# Patient Record
Sex: Male | Born: 1970
Health system: Southern US, Community
[De-identification: ages and names within clinical notes are randomized; demographics above are authoritative.]

## PROBLEM LIST (undated history)

## (undated) DIAGNOSIS — I10 Essential (primary) hypertension: Secondary | ICD-10-CM

## (undated) DIAGNOSIS — M1990 Unspecified osteoarthritis, unspecified site: Secondary | ICD-10-CM

## (undated) DIAGNOSIS — Z9289 Personal history of other medical treatment: Secondary | ICD-10-CM

## (undated) DIAGNOSIS — J45909 Unspecified asthma, uncomplicated: Secondary | ICD-10-CM

## (undated) DIAGNOSIS — K219 Gastro-esophageal reflux disease without esophagitis: Secondary | ICD-10-CM

## (undated) DIAGNOSIS — R011 Cardiac murmur, unspecified: Secondary | ICD-10-CM

## (undated) DIAGNOSIS — E785 Hyperlipidemia, unspecified: Secondary | ICD-10-CM

## (undated) HISTORY — DX: Unspecified asthma, uncomplicated: J45.909

## (undated) HISTORY — DX: Personal history of other medical treatment: Z92.89

## (undated) HISTORY — DX: Hyperlipidemia, unspecified: E78.5

## (undated) HISTORY — DX: Essential (primary) hypertension: I10

## (undated) HISTORY — DX: Cardiac murmur, unspecified: R01.1

## (undated) HISTORY — DX: Unspecified osteoarthritis, unspecified site: M19.90

## (undated) HISTORY — DX: Gastro-esophageal reflux disease without esophagitis: K21.9

---
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
## 1998-12-29 ENCOUNTER — Encounter: Payer: Self-pay | Admitting: *Deleted

## 1998-12-29 ENCOUNTER — Emergency Department (HOSPITAL_COMMUNITY): Admission: EM | Admit: 1998-12-29 | Discharge: 1998-12-29 | Payer: Self-pay | Admitting: Emergency Medicine

## 2000-12-14 ENCOUNTER — Emergency Department (HOSPITAL_COMMUNITY): Admission: EM | Admit: 2000-12-14 | Discharge: 2000-12-15 | Payer: Self-pay | Admitting: Emergency Medicine

## 2006-05-20 HISTORY — PX: FOOT FRACTURE SURGERY: SHX645

## 2008-05-23 ENCOUNTER — Emergency Department (HOSPITAL_COMMUNITY): Admission: EM | Admit: 2008-05-23 | Discharge: 2008-05-23 | Payer: Self-pay | Admitting: Emergency Medicine

## 2008-05-30 ENCOUNTER — Emergency Department (HOSPITAL_COMMUNITY): Admission: EM | Admit: 2008-05-30 | Discharge: 2008-05-30 | Payer: Self-pay | Admitting: Family Medicine

## 2010-08-31 ENCOUNTER — Ambulatory Visit (INDEPENDENT_AMBULATORY_CARE_PROVIDER_SITE_OTHER): Payer: Commercial Managed Care - PPO | Admitting: Cardiovascular Disease

## 2010-08-31 ENCOUNTER — Encounter: Payer: Self-pay | Admitting: Cardiovascular Disease

## 2010-08-31 VITALS — BP 140/94 | HR 71 | Ht 67.0 in | Wt 213.8 lb

## 2010-08-31 DIAGNOSIS — I10 Essential (primary) hypertension: Secondary | ICD-10-CM

## 2010-08-31 LAB — BASIC METABOLIC PANEL
BUN: 13 mg/dL (ref 6–23)
CO2: 29 mEq/L (ref 19–32)
Calcium: 9.4 mg/dL (ref 8.4–10.5)
Chloride: 101 mEq/L (ref 96–112)
Creatinine, Ser: 1 mg/dL (ref 0.4–1.5)
GFR: 90.03 mL/min (ref >60.00)
Glucose, Bld: 82 mg/dL (ref 70–99)
Potassium: 4.8 mEq/L (ref 3.5–5.1)
Sodium: 138 mEq/L (ref 135–145)

## 2010-08-31 MED ORDER — HYDROCHLOROTHIAZIDE 25 MG PO TABS: 25.0000 mg | ORAL_TABLET | Freq: Every day | ORAL | Status: DC

## 2010-08-31 MED ORDER — POTASSIUM CHLORIDE ER 10 MEQ PO TBCR: 10.0000 meq | EXTENDED_RELEASE_TABLET | Freq: Every day | ORAL | Status: DC

## 2010-08-31 NOTE — Progress Notes (Signed)
Jeffrey Sutton Date of Birth  1970-05-27 [facility] [address]     [address] [place], [place]  [address] [phone]  Fax  [phone]  History of Present Illness:  Jeffrey Sutton Is a young gentleman with a history of hypertension. He presents  today for followup visit. He has not had any episodes of chest pain or shortness of breath.  He lost his job over the past year. He has done Holiday representative and other eyedrops. He keeps very busy.   Current Outpatient Prescriptions  Medication Sig Dispense Refill  . DISCONTD: hydrochlorothiazide 25 MG tablet Take 25 mg by mouth daily.        Marland Kitchen DISCONTD: potassium chloride (K-DUR,KLOR-CON) 10 MEQ tablet Take 10 mEq by mouth daily.        Marland Kitchen aspirin 81 MG tablet Take 81 mg by mouth daily.        . hydrochlorothiazide 25 MG tablet Take 1 tablet (25 mg total) by mouth daily.  90 tablet  3  . potassium chloride (K-DUR) 10 MEQ tablet Take 1 tablet (10 mEq total) by mouth daily.  90 tablet  3     Allergies  Allergen Reactions  . Benadryl (Altaryl)   . Novocain     Past Medical History  Diagnosis Date  . Hypertension   . Chest pain     No past surgical history on file.  History  Smoking status  . Current Everyday Smoker -- 2.0 packs/day  . Types: Cigarettes  Smokeless tobacco  . Not on file  I recommended that he stop smoking.  History  Alcohol Use No    Family History  Problem Relation Age of Onset  . Cancer Mother     Reviw of Systems:  Reviewed in the HPI.  All other systems are negative.  Physical Exam: BP 140/94  Pulse 71  Ht 5\' 7"  (1.702 m)  Wt 213 lb 12.8 oz (96.979 kg)  BMI 33.49 kg/m2 The patient is alert and oriented x 3.  The mood and affect are normal.  The skin is warm and dry.  Color is normal.  The HEENT exam reveals that the sclera are nonicteric.  The mucous membranes are moist.  The carotids are 2+ without bruits.  There is no thyromegaly.  There is no JVD.  The  lungs are clear.  The chest wall is non tender.  The heart exam reveals a regular rate with a normal S1 and S2.  There are no murmurs, gallops, or rubs.  The PMI is not displaced.   Abdominal exam reveals good bowel sounds.  There is no guarding or rebound.  There is no hepatosplenomegaly or tenderness.  There are no masses.  Exam of the legs reveal no clubbing, cyanosis, or edema.  The legs are without rashes.  The distal pulses are intact.  Cranial nerves II - XII are intact.  Motor and sensory functions are intact.  The gait is normal.  ECG: Normal sinus rhythm. Normal EKG. Assessment / Plan:

## 2010-08-31 NOTE — Assessment & Plan Note (Signed)
His blood pressure is mildly elevated today. I've asked him to try to cut back and eventually stop smoking. This certainly will help with his hypertension. We will continue with his same medications. I'll see him again in one year.

## 2010-09-03 ENCOUNTER — Telehealth: Payer: Self-pay | Admitting: *Deleted

## 2010-09-03 NOTE — Telephone Encounter (Signed)
Patient called with lab results. msg left normal lab results, Alfonso Ramus RN

## 2010-09-03 NOTE — Telephone Encounter (Signed)
Message copied by Mahalia Longest on Mon Sep 03, 2010 12:27 PM ------      Message from: [place], [place]      Created: Fri Aug 31, 2010  4:54 PM       Randie Heinz

## 2010-09-06 ENCOUNTER — Encounter: Payer: Self-pay | Admitting: Cardiovascular Disease

## 2011-09-03 ENCOUNTER — Telehealth: Payer: Self-pay | Admitting: *Deleted

## 2011-09-03 DIAGNOSIS — I10 Essential (primary) hypertension: Secondary | ICD-10-CM

## 2011-09-03 MED ORDER — POTASSIUM CHLORIDE CRYS ER 10 MEQ PO TBCR: 10.0000 meq | EXTENDED_RELEASE_TABLET | Freq: Two times a day (BID) | ORAL | Status: DC

## 2011-09-03 MED ORDER — HYDROCHLOROTHIAZIDE 25 MG PO TABS: 25.0000 mg | ORAL_TABLET | Freq: Every day | ORAL | Status: DC

## 2011-09-03 NOTE — Telephone Encounter (Signed)
Pt request refills

## 2011-10-18 ENCOUNTER — Ambulatory Visit (INDEPENDENT_AMBULATORY_CARE_PROVIDER_SITE_OTHER): Payer: 59 | Admitting: Cardiovascular Disease

## 2011-10-18 ENCOUNTER — Encounter: Payer: Self-pay | Admitting: Cardiovascular Disease

## 2011-10-18 VITALS — BP 110/88 | HR 80 | Ht 67.0 in | Wt 205.1 lb

## 2011-10-18 DIAGNOSIS — I10 Essential (primary) hypertension: Secondary | ICD-10-CM

## 2011-10-18 LAB — BASIC METABOLIC PANEL
BUN: 13 mg/dL (ref 6–23)
CO2: 30 mEq/L (ref 19–32)
Calcium: 9.3 mg/dL (ref 8.4–10.5)
Creatinine, Ser: 1.2 mg/dL (ref 0.4–1.5)
GFR: 73.69 mL/min (ref >60.00)

## 2011-10-18 MED ORDER — HYDROCHLOROTHIAZIDE 25 MG PO TABS: 25.0000 mg | ORAL_TABLET | Freq: Every day | ORAL | Status: DC

## 2011-10-18 MED ORDER — POTASSIUM CHLORIDE CRYS ER 10 MEQ PO TBCR: 10.0000 meq | EXTENDED_RELEASE_TABLET | Freq: Two times a day (BID) | ORAL | Status: DC

## 2011-10-18 NOTE — Progress Notes (Signed)
    Jeffrey Sutton Date of Birth  1970-11-18       [facility]    [place] [address]  [address], [address] [place], [place]  [address]   [place], [place]  [address] [phone]     [phone]   Fax  [phone]    Fax [phone]  Problem List: 1. Hypertension  History of Present Illness:  Jeffrey Sutton is a 41 yo with hx of HTN.    Current Outpatient Prescriptions on File Prior to Visit  Medication Sig Dispense Refill  . hydrochlorothiazide (HYDRODIURIL) 25 MG tablet Take 1 tablet (25 mg total) by mouth daily.  90 tablet  0  . DISCONTD: potassium chloride (K-DUR,KLOR-CON) 10 MEQ tablet Take 1 tablet (10 mEq total) by mouth 2 (two) times daily.  90 tablet  0  . potassium chloride (K-DUR) 10 MEQ tablet Take 1 tablet (10 mEq total) by mouth daily.  90 tablet  3    Allergies  Allergen Reactions  . Benadryl (Diphenhydramine Hcl)   . Procaine Hcl     Past Medical History  Diagnosis Date  . Hypertension   . Chest pain     No past surgical history on file.  History  Smoking status  . Current Everyday Smoker -- 2.0 packs/day  . Types: Cigarettes  Smokeless tobacco  . Not on file    History  Alcohol Use No    Family History  Problem Relation Age of Onset  . Cancer Mother     Reviw of Systems:  Reviewed in the HPI.  All other systems are negative.  Physical Exam: Blood pressure 110/88, pulse 80, height 5\' 7"  (1.702 m), weight 205 lb 1.9 oz (93.042 kg). General: Well developed, well nourished, in no acute distress.  Head: Normocephalic, atraumatic, sclera non-icteric, mucus membranes are moist,   Neck: Supple. Carotids are 2 + without bruits. No JVD  Lungs: Clear bilaterally to auscultation.  Heart: regular rate.  normal  S1 S2. No murmurs, gallops or rubs.  Abdomen: Soft, non-tender, non-distended with normal bowel sounds. No hepatomegaly. No rebound/guarding. No masses.  Msk:  Strength and tone are  normal  Extremities: No clubbing or cyanosis. No edema.  Distal pedal pulses are 2+ and equal bilaterally.  Neuro: Alert and oriented X 3. Moves all extremities spontaneously.  Psych:  Responds to questions appropriately with a normal affect.  ECG: 10/18/2011-normal sinus rhythm at 80 beats a minute. Normal EKG  Assessment / Plan:

## 2011-10-18 NOTE — Assessment & Plan Note (Signed)
Jeffrey Sutton seems to be doing very well. His blood pressure is fairly well controlled.  We'll give him some information on the-diet. We'll continue with her same medications. We'll check a basic metabolic profile today.  I'll see her again in one year for an EKG and basic metabolic profile.

## 2011-10-18 NOTE — Patient Instructions (Signed)
Your physician wants you to follow-up in: 1 YEAR You will receive a reminder letter in the mail two months in advance. If you don't receive a letter, please call our office to schedule the follow-up appointment.  Your physician recommends that you return for lab work in: TODAY BMET  REDUCE HIGH SODIUM FOODS LIKE CANNED SOUP, GRAVY, SAUCES, READY PREPARED FOODS LIKE FROZEN FOODS; LEAN CUISINE, LASAGNA. BACON, SAUSAGE, LUNCH MEAT, FAST FOODS.Marland Kitchen   DASH Diet   The DASH diet stands for "Dietary Approaches to Stop Hypertension." It is a healthy eating plan that has been shown to reduce high blood pressure (hypertension) in as little as 14 days, while also possibly providing other significant health benefits. These other health benefits include reducing the risk of breast cancer after menopause and reducing the risk of type 2 diabetes, heart disease, colon cancer, and stroke. Health benefits also include weight loss and slowing kidney failure in patients with chronic kidney disease.  DIET GUIDELINES  Limit salt (sodium). Your diet should contain less than 1500 mg of sodium daily.   Limit refined or processed carbohydrates. Your diet should include mostly whole grains. Desserts and added sugars should be used sparingly.   Include small amounts of heart-healthy fats. These types of fats include nuts, oils, and tub margarine. Limit saturated and trans fats. These fats have been shown to be harmful in the body.  CHOOSING FOODS  The following food groups are based on a 2000 calorie diet. See your Registered Dietitian for individual calorie needs. Grains and Grain Products (6 to 8 servings daily)  Eat More Often: Whole-wheat bread, brown rice, whole-grain or wheat pasta, quinoa, popcorn without added fat or salt (air popped).   Eat Less Often: White bread, white pasta, white rice, cornbread.  Vegetables (4 to 5 servings daily)  Eat More Often: Fresh, frozen, and canned vegetables. Vegetables may be raw,  steamed, roasted, or grilled with a minimal amount of fat.   Eat Less Often/Avoid: Creamed or fried vegetables. Vegetables in a cheese sauce.  Fruit (4 to 5 servings daily)  Eat More Often: All fresh, canned (in natural juice), or frozen fruits. Dried fruits without added sugar. One hundred percent fruit juice ( cup [237 mL] daily).   Eat Less Often: Dried fruits with added sugar. Canned fruit in light or heavy syrup.  [employer], Fish, and Poultry (2 servings or less daily. One serving is 3 to 4 oz [85-114 g]).  Eat More Often: Ninety percent or leaner ground beef, tenderloin, sirloin. Round cuts of beef, chicken breast, [place] breast. All fish. Grill, bake, or broil your meat. Nothing should be fried.   Eat Less Often/Avoid: Fatty cuts of meat, [place], or chicken leg, thigh, or wing. Fried cuts of meat or fish.  Dairy (2 to 3 servings)  Eat More Often: Low-fat or fat-free milk, low-fat plain or light yogurt, reduced-fat or part-skim cheese.   Eat Less Often/Avoid: Milk (whole, 2%, skim, or chocolate).Whole milk yogurt. Full-fat cheeses.  Nuts, Seeds, and Legumes (4 to 5 servings per week)  Eat More Often: All without added salt.   Eat Less Often/Avoid: Salted nuts and seeds, canned beans with added salt.  Fats and Sweets (limited)  Eat More Often: Vegetable oils, tub margarines without trans fats, sugar-free gelatin. Mayonnaise and salad dressings.   Eat Less Often/Avoid: Coconut oils, palm oils, butter, stick margarine, cream, half and half, cookies, candy, pie.  FOR MORE INFORMATION The Dash Diet Eating Plan: www.dashdiet.org Document Released: 04/25/2011 Document  Reviewed: 04/15/2011 [facility] Patient Information 2012 [place], [place].

## 2011-10-28 ENCOUNTER — Telehealth: Payer: Self-pay | Admitting: Cardiovascular Disease

## 2011-10-28 NOTE — Telephone Encounter (Signed)
Lab results were given to Jeffrey Sutton.  (see result note on labs)

## 2011-10-28 NOTE — Telephone Encounter (Signed)
New Prbolem:    Patient called in returning Jodette's call about his labwork results.  Please call back.

## 2012-05-26 ENCOUNTER — Other Ambulatory Visit: Payer: Self-pay | Admitting: *Deleted

## 2012-05-26 DIAGNOSIS — I10 Essential (primary) hypertension: Secondary | ICD-10-CM

## 2012-05-26 MED ORDER — POTASSIUM CHLORIDE CRYS ER 10 MEQ PO TBCR: 10.0000 meq | EXTENDED_RELEASE_TABLET | Freq: Every day | ORAL | Status: DC

## 2012-05-26 NOTE — Telephone Encounter (Signed)
NEED APPOINTMENT Fax Received. Refill Completed. Jeffrey Sutton (R.M.A)   

## 2012-05-27 ENCOUNTER — Other Ambulatory Visit: Payer: Self-pay | Admitting: *Deleted

## 2012-05-29 ENCOUNTER — Other Ambulatory Visit: Payer: Self-pay | Admitting: Cardiovascular Disease

## 2012-05-29 DIAGNOSIS — I10 Essential (primary) hypertension: Secondary | ICD-10-CM

## 2012-05-29 MED ORDER — POTASSIUM CHLORIDE CRYS ER 10 MEQ PO TBCR: 10.0000 meq | EXTENDED_RELEASE_TABLET | Freq: Every day | ORAL | Status: DC

## 2012-05-29 NOTE — Progress Notes (Signed)
Refilled Kdur at Pt's wife request.  Will need BMP on return

## 2012-06-17 ENCOUNTER — Ambulatory Visit: Payer: Self-pay | Admitting: Family Medicine

## 2012-06-17 VITALS — BP 129/93 | HR 101 | Temp 98.1°F | Resp 18 | Ht 69.0 in | Wt 213.0 lb

## 2012-06-17 DIAGNOSIS — Z0289 Encounter for other administrative examinations: Secondary | ICD-10-CM

## 2012-06-17 DIAGNOSIS — Z024 Encounter for examination for driving license: Secondary | ICD-10-CM

## 2012-06-17 LAB — POCT URINALYSIS DIPSTICK
Bilirubin, UA: NEGATIVE
Ketones, UA: NEGATIVE
Leukocytes, UA: NEGATIVE
Spec Grav, UA: >=1.03

## 2012-06-17 NOTE — Patient Instructions (Addendum)
Your UA looks fine today. Take care

## 2012-06-17 NOTE — Progress Notes (Signed)
[facility] [address], [place] [address] [phone]  Date:  06/17/2012   Name:  Jeffrey Sutton   DOB:  04/30/1971   MRN:  [id number]  PCP:  No primary provider on file.    Chief Complaint: Other   History of Present Illness:  Jeffrey Sutton is a 42 y.o. 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  He had his PE performed at an other office in [place] yesterday- however they were not able to perform a UA.  He actually does not need a drug screen or any other test.  He otherwise qualifies for a 2 year card.  He brought his DOT forms with him from yesterday and the only problem is the lack of UA result  Patient Active Problem List  Diagnosis  . Hypertension  . Chest pain    Past Medical History  Diagnosis Date  . Hypertension   . Chest pain     No past surgical history on file.  History  Substance Use Topics  . Smoking status: Current Every Day Smoker -- 2.0 packs/day    Types: Cigarettes  . Smokeless tobacco: Not on file  . Alcohol Use: No    Family History  Problem Relation Age of Onset  . Cancer Mother     Allergies  Allergen Reactions  . Benadryl (Diphenhydramine Hcl)   . Procaine Hcl     Medication list has been reviewed and updated.  Current Outpatient Prescriptions on File Prior to Visit  Medication Sig Dispense Refill  . hydrochlorothiazide (HYDRODIURIL) 25 MG tablet Take 1 tablet (25 mg total) by mouth daily.  90 tablet  3  . potassium chloride (K-DUR,KLOR-CON) 10 MEQ tablet Take 1 tablet (10 mEq total) by mouth daily.  90 tablet  1  . potassium chloride (K-DUR) 10 MEQ tablet Take 1 tablet (10 mEq total) by mouth daily.  90 tablet  3    Review of Systems:  As per HPI- otherwise negative.   Physical Examination: Filed Vitals:   06/17/12 0857  BP: 129/93  Pulse: 101  Temp: 98.1 F (36.7 C)  Resp: 18   Filed Vitals:   06/17/12 0857  Height: 5\' 9"   (1.753 m)  Weight: 213 lb (96.616 kg)   Body mass index is 31.45 kg/(m^2). Ideal Body Weight: Weight in (lb) to have BMI = 25: 168.9    GEN: WDWN, NAD, Non-toxic, Alert & Oriented x 3 HEENT: Atraumatic, Normocephalic.  Ears and Nose: No external deformity. EXTR: No clubbing/cyanosis/edema NEURO: Normal gait.  PSYCH: Normally interactive. Conversant. Not depressed or anxious appearing.  Calm demeanor.   Results for orders placed in visit on 06/17/12  POCT URINALYSIS DIPSTICK      Component Value Range   Color, UA yellow     Clarity, UA clear     Glucose, UA neg     Bilirubin, UA neg     Ketones, UA neg     Spec Grav, UA >=1.030     Blood, UA neg     pH, UA 6.0     Protein, UA neg     Urobilinogen, UA 0.2     Nitrite, UA neg     Leukocytes, UA Negative      Assessment and Plan: 1. Encounter for commercial driving license (CDL) exam  POCT urinalysis dipstick   Here for UA today only.  Looks fine, wrote  an addendum on his form  Vlada Uriostegui, MD

## 2012-09-18 ENCOUNTER — Other Ambulatory Visit: Payer: Self-pay | Admitting: *Deleted

## 2012-09-18 DIAGNOSIS — I10 Essential (primary) hypertension: Secondary | ICD-10-CM

## 2012-09-18 MED ORDER — POTASSIUM CHLORIDE CRYS ER 10 MEQ PO TBCR: 20.0000 meq | EXTENDED_RELEASE_TABLET | Freq: Every day | ORAL | Status: DC

## 2012-09-18 NOTE — Telephone Encounter (Signed)
Pt walked in needing k+ filled and that it was filled last for 10 meq once daily/ last script per last lab should be 20 meq daily. Script changed and sent/ pt to have app made.

## 2012-11-18 ENCOUNTER — Ambulatory Visit (INDEPENDENT_AMBULATORY_CARE_PROVIDER_SITE_OTHER): Payer: 59 | Admitting: Cardiovascular Disease

## 2012-11-18 ENCOUNTER — Encounter: Payer: Self-pay | Admitting: Cardiovascular Disease

## 2012-11-18 VITALS — BP 134/90 | HR 87 | Ht 69.0 in | Wt 211.0 lb

## 2012-11-18 DIAGNOSIS — I10 Essential (primary) hypertension: Secondary | ICD-10-CM

## 2012-11-18 DIAGNOSIS — R079 Chest pain, unspecified: Secondary | ICD-10-CM

## 2012-11-18 LAB — BASIC METABOLIC PANEL
CO2: 24 mEq/L (ref 19–32)
Chloride: 101 mEq/L (ref 96–112)
Sodium: 138 mEq/L (ref 135–145)

## 2012-11-18 NOTE — Patient Instructions (Signed)
Your physician wants you to follow-up in: 1 year You will receive a reminder letter in the mail two months in advance. If you don't receive a letter, please call our office to schedule the follow-up appointment.  Your physician recommends that you return for lab work in: today/ bmet

## 2012-11-18 NOTE — Assessment & Plan Note (Signed)
His BP remains mildly elevated.  He needs to sleep better and needs to exercise regularly.  Will continue same meds. I will see him in 1 year.

## 2012-11-18 NOTE — Progress Notes (Signed)
    Jeffrey Sutton Date of Birth  18-Apr-1971       [facility]    [place] [address]  [address] [place], [place]  [address]   [place], [place]  [address] [phone]     [phone]   Fax  [phone]    Fax [phone]  Problem List: 1. Hypertension  History of Present Illness:  Jeffrey Sutton is a 42 yo with hx of HTN.    November 18, 2012:  Jeffrey Sutton presents today- he had a severe episode of pleurisy about 1 month ago. The pain was severe and only occurred with a deep breath.  He has a chronic cough.  No hemoptysis. No fever.  The pain lasted for 1 hour followed by some very mild pains.    He has felt well.  He has not been sleeping well - perhaps 4 hours a night.   He is active and exercise regularly.    Current Outpatient Prescriptions on File Prior to Visit  Medication Sig Dispense Refill  . hydrochlorothiazide (HYDRODIURIL) 25 MG tablet Take 1 tablet (25 mg total) by mouth daily.  90 tablet  3   No current facility-administered medications on file prior to visit.    Allergies  Allergen Reactions  . Benadryl (Diphenhydramine Hcl)   . Procaine Hcl     Past Medical History  Diagnosis Date  . Hypertension   . Chest pain     No past surgical history on file.  History  Smoking status  . Current Every Day Smoker -- 2.00 packs/day  . Types: Cigarettes  Smokeless tobacco  . Not on file    History  Alcohol Use No    Family History  Problem Relation Age of Onset  . Cancer Mother     Reviw of Systems:  Reviewed in the HPI.  All other systems are negative.  Physical Exam: Blood pressure 134/90, pulse 87, height 5\' 9"  (1.753 m), weight 211 lb (95.709 kg). General: Well developed, well nourished, in no acute distress.  Head: Normocephalic, atraumatic, sclera non-icteric, mucus membranes are moist,   Neck: Supple. Carotids are 2 + without bruits. No JVD  Lungs: Clear bilaterally to auscultation.  Heart:  regular rate.  normal  S1 S2. No murmurs, gallops or rubs.  Abdomen: Soft, non-tender, non-distended with normal bowel sounds. No hepatomegaly. No rebound/guarding. No masses.  Msk:  Strength and tone are normal  Extremities: No clubbing or cyanosis. No edema.  Distal pedal pulses are 2+ and equal bilaterally.  Neuro: Alert and oriented X 3. Moves all extremities spontaneously.  Psych:  Responds to questions appropriately with a normal affect.  ECG: November 18, 2012:  NSR ay 87.  NS ST abnormality.  Assessment / Plan:

## 2012-11-23 ENCOUNTER — Telehealth: Payer: Self-pay | Admitting: *Deleted

## 2012-11-23 DIAGNOSIS — I10 Essential (primary) hypertension: Secondary | ICD-10-CM

## 2012-11-23 DIAGNOSIS — E876 Hypokalemia: Secondary | ICD-10-CM

## 2012-11-23 MED ORDER — POTASSIUM CHLORIDE ER 10 MEQ PO TBCR: 10.0000 meq | EXTENDED_RELEASE_TABLET | Freq: Two times a day (BID) | ORAL | Status: DC

## 2012-11-23 MED ORDER — HYDROCHLOROTHIAZIDE 25 MG PO TABS: 25.0000 mg | ORAL_TABLET | Freq: Every day | ORAL | Status: DC

## 2012-11-23 NOTE — Telephone Encounter (Signed)
Pt made aware and will dbl up on k+, lab date given.

## 2012-11-23 NOTE — Telephone Encounter (Signed)
Message copied by Antony Odea on Mon Nov 23, 2012  9:36 AM ------      Message from: Vesta Mixer      Created: Wed Nov 18, 2012  6:39 PM       Potassium is a bit lot.  Have him double his kdur to 10 meq  bid ( or 20 meq in the am.)  Recheck in several weeks. ------

## 2012-12-10 ENCOUNTER — Other Ambulatory Visit (INDEPENDENT_AMBULATORY_CARE_PROVIDER_SITE_OTHER): Payer: 59

## 2012-12-10 DIAGNOSIS — E876 Hypokalemia: Secondary | ICD-10-CM

## 2012-12-10 DIAGNOSIS — I10 Essential (primary) hypertension: Secondary | ICD-10-CM

## 2012-12-10 LAB — BASIC METABOLIC PANEL
BUN: 13 mg/dL (ref 6–23)
CO2: 28 mEq/L (ref 19–32)
Chloride: 102 mEq/L (ref 96–112)
Glucose, Bld: 97 mg/dL (ref 70–99)
Potassium: 3.3 mEq/L — ABNORMAL LOW (ref 3.5–5.1)

## 2012-12-11 ENCOUNTER — Telehealth: Payer: Self-pay | Admitting: *Deleted

## 2012-12-11 DIAGNOSIS — I10 Essential (primary) hypertension: Secondary | ICD-10-CM

## 2012-12-11 DIAGNOSIS — E876 Hypokalemia: Secondary | ICD-10-CM

## 2012-12-11 MED ORDER — POTASSIUM CHLORIDE ER 10 MEQ PO TBCR: 20.0000 meq | EXTENDED_RELEASE_TABLET | Freq: Two times a day (BID) | ORAL | Status: DC

## 2012-12-11 NOTE — Telephone Encounter (Signed)
K+ still low 3.3, pt drinks 4-6 red bulls daily. Pt is taking k+ as prescribed 20 meq. Told him if he reduced caffeine intake we may need to adjust his K+, verbalized understanding. Increased to 20 meq bid Lab date given 8/7 bmet repeat.

## 2012-12-13 NOTE — Telephone Encounter (Signed)
Agree,  I think he should decrease his red bull intake.

## 2012-12-24 ENCOUNTER — Other Ambulatory Visit: Payer: 59

## 2012-12-31 NOTE — Telephone Encounter (Signed)
Pt did not come for repeat k+ lab draw. Called pt home # left msg to call back and set lab date/ order is in the system// or just come in and he can be added to lab schedule then. Expressed importance of repeat lab work

## 2013-01-19 ENCOUNTER — Telehealth: Payer: Self-pay | Admitting: *Deleted

## 2013-01-19 NOTE — Telephone Encounter (Signed)
Called and left msg to call to set bmet/lab app. Number provided.

## 2013-01-19 NOTE — Telephone Encounter (Signed)
msg left to call back to have repeat bmet/ pt has been called reminders several times

## 2013-02-03 ENCOUNTER — Emergency Department (HOSPITAL_COMMUNITY)
Admission: EM | Admit: 2013-02-03 | Discharge: 2013-02-03 | Disposition: A | Discharge status: Home / Self Care | Payer: 59 | Source: Home / Self Care | Attending: Emergency Medicine | Admitting: Emergency Medicine

## 2013-02-03 ENCOUNTER — Encounter (HOSPITAL_COMMUNITY): Payer: Self-pay | Admitting: Emergency Medicine

## 2013-02-03 DIAGNOSIS — J039 Acute tonsillitis, unspecified: Secondary | ICD-10-CM

## 2013-02-03 MED ORDER — PREDNISONE 20 MG PO TABS: 20.0000 mg | ORAL_TABLET | Freq: Two times a day (BID) | ORAL | Status: DC

## 2013-02-03 MED ORDER — AMOXICILLIN-POT CLAVULANATE 875-125 MG PO TABS: 1.0000 | ORAL_TABLET | Freq: Two times a day (BID) | ORAL | Status: DC

## 2013-02-03 NOTE — ED Notes (Signed)
Pt c/o left ear pain onset yest Pain is gradually getting worse... sxs also include: congestion Denies: fevers... Taking OTC ear drops He is alert w/no signs of acute distress.

## 2013-02-03 NOTE — ED Provider Notes (Signed)
Chief Complaint:   Chief Complaint  Patient presents with  . Otalgia    History of Present Illness:   Jeffrey Sutton is a 42 year old male who has had a two-day history of left ear pain and congestion. This hurts worse when he swallows. He's also had some nasal congestion and a sore throat. He denies fever, chills, sweats, headache, or rhinorrhea. He's had no stiff neck or adenopathy. He denies any coughing or GI symptoms. He's had no known sick exposures.  Review of Systems:  Other than noted above, the patient denies any of the following symptoms: Systemic:  No fevers, chills, sweats, weight loss or gain, fatigue, or tiredness. Eye:  No redness, pain, discharge, itching, blurred vision, or diplopia. ENT:  No headache, nasal congestion, sneezing, itching, epistaxis, ear pain, congestion, decreased hearing, ringing in ears, vertigo, or tinnitus.  No oral lesions, sore throat, pain on swallowing, or hoarseness. Neck:  No mass, tenderness or adenopathy. Lungs:  No coughing, wheezing, or shortness of breath. Skin:  No rash or itching.  PMFSH:  Past medical history, family history, social history, meds, and allergies were reviewed. He takes hydrochlorothiazide for high blood pressure and a testosterone supplement.  Physical Exam:   Vital signs:  There were no vitals taken for this visit. General:  Alert and oriented.  In no distress.  Skin warm and dry. Eye:  PERRL, full EOMs, lids and conjunctiva normal.   ENT:  TMs and canals clear.  Nasal mucosa not congested and without drainage.  Mucous membranes moist, no oral lesions, normal dentition, pharynx shows marked swelling of the tonsils, erythema, and some white exudate. There was no bulging the anterior tonsillar pillars and uvula was midline. No cranial or facial pain to palplation. Neck:  Supple, full ROM.  No adenopathy, tenderness or mass.  Thyroid normal. Lungs:  Breath sounds clear and equal bilaterally.  No wheezes, rales or  rhonchi. Heart:  Rhythm regular, without extrasystoles.  No gallops or murmers. Skin:  Clear, warm and dry.  Labs:   Results for orders placed during the hospital encounter of 02/03/13  POCT RAPID STREP A ([facility] CARE ONLY)      Result Value Range   Streptococcus, Group A Screen (Direct) NEGATIVE  NEGATIVE    Assessment:  The encounter diagnosis was Tonsillitis.  No evidence of peritonsillar abscess.  Plan:   1.  Meds:  The following meds were prescribed:   Discharge Medication List as of 02/03/2013  2:35 PM    START taking these medications   Details  amoxicillin-clavulanate (AUGMENTIN) 875-125 MG per tablet Take 1 tablet by mouth 2 (two) times daily., Starting 02/03/2013, Until Discontinued, Normal    predniSONE (DELTASONE) 20 MG tablet Take 1 tablet (20 mg total) by mouth 2 (two) times daily., Starting 02/03/2013, Until Discontinued, Normal        2.  Patient Education/Counseling:  The patient was given appropriate handouts, self care instructions, and instructed in symptomatic relief.  Suggested warm salt water gargles and throat lozenges.  3.  Follow up:  The patient was told to follow up if no better in 3 to 4 days, if becoming worse in any way, and given some red flag symptoms such as any difficulty swallowing or breathing which would prompt immediate return.  Follow up here if necessary.     Reuben Likes, MD 02/03/13 2135

## 2013-02-05 LAB — CULTURE, GROUP A STREP

## 2013-02-12 NOTE — Telephone Encounter (Signed)
I Called and left another msg at home number informing him to call to schedule a lab app for bmet- informed him I will need to place a hold on his refills / tried work number but they didn't know anyone by that name at that number.  I called the [facility] pharmacy and asked them to place a hold on his HCTZ and K+ till he has a lab draw, pharm D agreed to plan.

## 2013-04-22 ENCOUNTER — Other Ambulatory Visit (INDEPENDENT_AMBULATORY_CARE_PROVIDER_SITE_OTHER): Payer: 59

## 2013-04-22 DIAGNOSIS — E876 Hypokalemia: Secondary | ICD-10-CM

## 2013-04-22 DIAGNOSIS — I10 Essential (primary) hypertension: Secondary | ICD-10-CM

## 2013-04-22 LAB — BASIC METABOLIC PANEL
CO2: 31 mEq/L (ref 19–32)
Calcium: 9 mg/dL (ref 8.4–10.5)
Glucose, Bld: 138 mg/dL — ABNORMAL HIGH (ref 70–99)
Potassium: 3.3 mEq/L — ABNORMAL LOW (ref 3.5–5.1)
Sodium: 137 mEq/L (ref 135–145)

## 2013-04-26 ENCOUNTER — Telehealth: Payer: Self-pay | Admitting: *Deleted

## 2013-04-26 DIAGNOSIS — E876 Hypokalemia: Secondary | ICD-10-CM

## 2013-04-26 DIAGNOSIS — I10 Essential (primary) hypertension: Secondary | ICD-10-CM

## 2013-04-26 MED ORDER — POTASSIUM CHLORIDE ER 10 MEQ PO TBCR: 20.0000 meq | EXTENDED_RELEASE_TABLET | Freq: Every day | ORAL | Status: DC

## 2013-04-26 MED ORDER — TRIAMTERENE-HCTZ 37.5-25 MG PO TABS: 1.0000 | ORAL_TABLET | Freq: Every day | ORAL | Status: DC

## 2013-04-26 NOTE — Telephone Encounter (Signed)
Reviewed with pt/ lab date given, pt verbalized understanding to stop HCTZ reduce K+ and start new med.

## 2013-04-26 NOTE — Telephone Encounter (Signed)
Message copied by Antony Odea on Mon Apr 26, 2013  5:51 PM ------      Message from: Vesta Mixer      Created: Fri Apr 23, 2013  4:24 PM       His potassium remains low.  Please have him DC the HCTZ and start Maxzide 37.5 / 25 and reduce the KCL to 20 meq a day.  Recheck BMP in 1 week       ------

## 2013-05-04 ENCOUNTER — Other Ambulatory Visit (INDEPENDENT_AMBULATORY_CARE_PROVIDER_SITE_OTHER): Payer: 59

## 2013-05-04 DIAGNOSIS — I10 Essential (primary) hypertension: Secondary | ICD-10-CM

## 2013-05-04 DIAGNOSIS — E876 Hypokalemia: Secondary | ICD-10-CM

## 2013-05-05 LAB — BASIC METABOLIC PANEL
BUN: 13 mg/dL (ref 6–23)
Calcium: 9.1 mg/dL (ref 8.4–10.5)
Chloride: 104 mEq/L (ref 96–112)
GFR: 73.88 mL/min (ref >60.00)
Glucose, Bld: 87 mg/dL (ref 70–99)
Potassium: 4.1 mEq/L (ref 3.5–5.1)

## 2013-05-25 ENCOUNTER — Other Ambulatory Visit: Payer: Self-pay | Admitting: Cardiovascular Disease

## 2013-06-18 ENCOUNTER — Other Ambulatory Visit: Payer: Self-pay | Admitting: Cardiovascular Disease

## 2013-06-21 ENCOUNTER — Other Ambulatory Visit: Payer: Self-pay | Admitting: Cardiovascular Disease

## 2013-12-22 ENCOUNTER — Other Ambulatory Visit: Payer: Self-pay | Admitting: *Deleted

## 2013-12-22 DIAGNOSIS — E876 Hypokalemia: Secondary | ICD-10-CM

## 2013-12-22 MED ORDER — TRIAMTERENE-HCTZ 37.5-25 MG PO TABS: 1.0000 | ORAL_TABLET | Freq: Every day | ORAL | Status: DC

## 2014-01-14 ENCOUNTER — Encounter: Payer: Self-pay | Admitting: Cardiovascular Disease

## 2014-01-14 ENCOUNTER — Ambulatory Visit (INDEPENDENT_AMBULATORY_CARE_PROVIDER_SITE_OTHER): Payer: 59 | Admitting: Cardiovascular Disease

## 2014-01-14 VITALS — BP 116/88 | HR 67 | Ht 69.0 in | Wt 219.1 lb

## 2014-01-14 DIAGNOSIS — E876 Hypokalemia: Secondary | ICD-10-CM

## 2014-01-14 DIAGNOSIS — I10 Essential (primary) hypertension: Secondary | ICD-10-CM

## 2014-01-14 LAB — BASIC METABOLIC PANEL
BUN: 10 mg/dL (ref 6–23)
CO2: 26 mEq/L (ref 19–32)
Calcium: 8.8 mg/dL (ref 8.4–10.5)
Chloride: 101 mEq/L (ref 96–112)
Creatinine, Ser: 1.2 mg/dL (ref 0.4–1.5)
GFR: 70.79 mL/min (ref >60.00)
GLUCOSE: 88 mg/dL (ref 70–99)
POTASSIUM: 3.6 meq/L (ref 3.5–5.1)
Sodium: 136 mEq/L (ref 135–145)

## 2014-01-14 MED ORDER — TRIAMTERENE-HCTZ 37.5-25 MG PO TABS: 1.0000 | ORAL_TABLET | Freq: Every day | ORAL | Status: DC

## 2014-01-14 MED ORDER — POTASSIUM CHLORIDE ER 20 MEQ PO TBCR: 20.0000 meq | EXTENDED_RELEASE_TABLET | Freq: Every day | ORAL | Status: DC

## 2014-01-14 NOTE — Assessment & Plan Note (Signed)
Jeffrey Sutton  is doing well. His blood pressures well controlled. He's not having any cardiac problems. I'll see him  again in one year for office visit.  Will check bmp today.

## 2014-01-14 NOTE — Progress Notes (Signed)
    Jeffrey Sutton Date of Birth  [birth year]       [facility]    [employer] [address], [address], [place], [place]  [address]   [place], [place]  [address] [phone]     [phone]   Fax  [phone]    Fax [phone]  Problem List: 1. Hypertension  History of Present Illness:  Jeffrey Sutton is a 43 yo with hx of HTN.    November 18, 2012:  Jeffrey Sutton presents today- he had a severe episode of pleurisy about 1 month ago. The pain was severe and only occurred with a deep breath.  He has a chronic cough.  No hemoptysis. No fever.  The pain lasted for 1 hour followed by some very mild pains.    He has felt well.  He has not been sleeping well - perhaps 4 hours a night.   He is active and exercise regularly.    January 14, 2014:  Jeffrey Sutton is doing well.  He is now doing tatoo work full time.  Marland Kitchen  He occasionally has some atypical CP .  Just lasted 1-2 seconds.   Exercising some   Current Outpatient Prescriptions on File Prior to Visit  Medication Sig Dispense Refill  . clomiPHENE (CLOMID) 50 MG tablet Take 50 mg by mouth daily.      . potassium chloride (K-DUR) 10 MEQ tablet Take 2 tablets (20 mEq total) by mouth daily.  120 tablet  6  . triamterene-hydrochlorothiazide (MAXZIDE-25) 37.5-25 MG per tablet Take 1 tablet by mouth daily.  30 tablet  0   No current facility-administered medications on file prior to visit.    Allergies  Allergen Reactions  . Benadryl [Diphenhydramine Hcl]   . Procaine Hcl     Past Medical History  Diagnosis Date  . Hypertension   . Chest pain     No past surgical history on file.  History  Smoking status  . Current Every Day Smoker -- 2.00 packs/day  . Types: Cigarettes  Smokeless tobacco  . Not on file    History  Alcohol Use No    Family History  Problem Relation Age of Onset  . Cancer Mother     Reviw of Systems:  Reviewed in the HPI.  All other systems are negative.  Physical  Exam: Blood pressure 116/88, pulse 67, height 5\' 9"  (1.753 m), weight 219 lb 1.9 oz (99.392 kg). General: Well developed, well nourished, in no acute distress.  Head: Normocephalic, atraumatic, sclera non-icteric, mucus membranes are moist,   Neck: Supple. Carotids are 2 + without bruits. No JVD  Lungs: Clear bilaterally to auscultation.  Heart: regular rate.  normal  S1 S2. No murmurs, gallops or rubs.  Abdomen: Soft, non-tender, non-distended with normal bowel sounds. No hepatomegaly. No rebound/guarding. No masses.  Msk:  Strength and tone are normal  Extremities: No clubbing or cyanosis. No edema.  Distal pedal pulses are 2+ and equal bilaterally.  Neuro: Alert and oriented X 3. Moves all extremities spontaneously.  Psych:  Responds to questions appropriately with a normal affect.  ECG:  January 14, 2014:   NSR at 41  Assessment / Plan:

## 2014-01-14 NOTE — Patient Instructions (Signed)
Your physician recommends that you continue on your current medications as directed. Please refer to the Current Medication list given to you today.  Your physician wants you to follow-up in: 1 year with Dr. Nahser.  You will receive a reminder letter in the mail two months in advance. If you don't receive a letter, please call our office to schedule the follow-up appointment.  

## 2014-01-25 ENCOUNTER — Encounter: Payer: Self-pay | Admitting: Nurse Practitioner

## 2014-02-01 ENCOUNTER — Telehealth: Payer: Self-pay | Admitting: Cardiovascular Disease

## 2014-02-01 NOTE — Telephone Encounter (Signed)
New Message ° °Pt returned call for results // Please call °

## 2014-02-01 NOTE — Telephone Encounter (Signed)
Notified of lab results. 

## 2014-04-08 ENCOUNTER — Telehealth: Payer: Self-pay | Admitting: Cardiovascular Disease

## 2014-04-08 NOTE — Telephone Encounter (Signed)
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  Wanted to know if we could call triamterene/HCTZ 37.5/25 to [facility] in [place].  Advised that insurance may not pay for it.  States he only wants 5 days and will have meds mailed to him.  Called [facility] at [phone] and called 5 days worth of Triamterene/HCTZ 37.5/25.

## 2014-04-08 NOTE — Telephone Encounter (Signed)
New Msg   Patient calling, states he is on business trip and has left meds at home. Patient would like Meds called in to pharmacy near him if possible. [phone].

## 2015-01-10 ENCOUNTER — Other Ambulatory Visit: Payer: Self-pay | Admitting: Cardiovascular Disease

## 2015-04-06 ENCOUNTER — Encounter: Payer: Self-pay | Admitting: Cardiovascular Disease

## 2015-04-06 ENCOUNTER — Ambulatory Visit (INDEPENDENT_AMBULATORY_CARE_PROVIDER_SITE_OTHER): Payer: 59 | Admitting: Cardiovascular Disease

## 2015-04-06 VITALS — BP 130/86 | HR 80 | Ht 69.0 in | Wt 214.4 lb

## 2015-04-06 DIAGNOSIS — I10 Essential (primary) hypertension: Secondary | ICD-10-CM | POA: Diagnosis not present

## 2015-04-06 LAB — BASIC METABOLIC PANEL
BUN: 14 mg/dL (ref 7–25)
CALCIUM: 9.2 mg/dL (ref 8.6–10.3)
CO2: 26 mmol/L (ref 20–31)
CREATININE: 0.94 mg/dL (ref 0.60–1.35)
Chloride: 102 mmol/L (ref 98–110)
GLUCOSE: 85 mg/dL (ref 65–99)
Potassium: 4.1 mmol/L (ref 3.5–5.3)
SODIUM: 138 mmol/L (ref 135–146)

## 2015-04-06 MED ORDER — TRIAMTERENE-HCTZ 37.5-25 MG PO TABS: 1.0000 | ORAL_TABLET | Freq: Every day | ORAL | Status: DC

## 2015-04-06 NOTE — Patient Instructions (Signed)
Medication Instructions:  Your physician recommends that you continue on your current medications as directed. Please refer to the Current Medication list given to you today.   Labwork: TODAY - basic metabolic panel   Testing/Procedures: None Ordered   Follow-Up: Your physician wants you to follow-up in: 1 year with Dr. Nahser. You will receive a reminder letter in the mail two months in advance. If you don't receive a letter, please call our office to schedule the follow-up appointment.   If you need a refill on your cardiac medications before your next appointment, please call your pharmacy.   Thank you for choosing [facility]! Michelle Swinyer, RN [phone]    

## 2015-04-06 NOTE — Addendum Note (Signed)
Addended by: Eulis Foster on: 04/06/2015 08:08 AM   Modules accepted: Orders

## 2015-04-06 NOTE — Progress Notes (Signed)
    Jeffrey Sutton Date of Birth  1970/11/21       [facility]    [employer] [address], [address], [place], [place]  [address]   [place], [place]  [address] [phone]     [phone]   Fax  [phone]    Fax [phone]  Problem List: 1. Hypertension  History of Present Illness:  Jeffrey Sutton is a 44 yo with hx of HTN.    November 18, 2012:  Jeffrey Sutton presents today- he had a severe episode of pleurisy about 1 month ago. The pain was severe and only occurred with a deep breath.  He has a chronic cough.  No hemoptysis. No fever.  The pain lasted for 1 hour followed by some very mild pains.    He has felt well.  He has not been sleeping well - perhaps 4 hours a night.   He is active and exercise regularly.    January 14, 2014:  Jeffrey Sutton is doing well.  He is now doing tatoo work full time.  Marland Kitchen  He occasionally has some atypical CP .  Just lasted 1-2 seconds.   Exercising some   Nov. 17, 2016:  Traveling quite a bit.  Exercising some   Current Outpatient Prescriptions on File Prior to Visit  Medication Sig Dispense Refill  . clomiPHENE (CLOMID) 50 MG tablet Take 50 mg by mouth daily.    . potassium chloride SA (K-DUR,KLOR-CON) 20 MEQ tablet TAKE 1 TABLET BY MOUTH ONCE DAILY 90 tablet 0   No current facility-administered medications on file prior to visit.    Allergies  Allergen Reactions  . Benadryl [Diphenhydramine Hcl]     unknown  . Procaine Hcl     unknown    Past Medical History  Diagnosis Date  . Hypertension   . Chest pain     No past surgical history on file.  History  Smoking status  . Current Every Day Smoker -- 2.00 packs/day  . Types: Cigarettes  Smokeless tobacco  . Not on file    History  Alcohol Use No    Family History  Problem Relation Age of Onset  . Cancer Mother     Reviw of Systems:  Reviewed in the HPI.  All other systems are negative.  Physical Exam: Blood pressure 130/86, pulse  80, height 5\' 9"  (1.753 m), weight 214 lb 6.4 oz (97.251 kg). General: Well developed, well nourished, in no acute distress.  Head: Normocephalic, atraumatic, sclera non-icteric, mucus membranes are moist,   Neck: Supple. Carotids are 2 + without bruits. No JVD  Lungs: Clear bilaterally to auscultation.  Heart: regular rate.  normal  S1 S2. No murmurs, gallops or rubs.  Abdomen: Soft, non-tender, non-distended with normal bowel sounds. No hepatomegaly. No rebound/guarding. No masses.  Msk:  Strength and tone are normal  Extremities: No clubbing or cyanosis. No edema.  Distal pedal pulses are 2+ and equal bilaterally.  Neuro: Alert and oriented X 3. Moves all extremities spontaneously.  Psych:  Responds to questions appropriately with a normal affect.  ECG:  Nov. 17,  Normal ECG at 80    Assessment / Plan:   1. Essential HTN:   Doing great.   Will refill maxzide  Will see him in 1 year    2. Smoking:  Encouraged him to stop.  Discussed cessation

## 2015-04-07 ENCOUNTER — Other Ambulatory Visit: Payer: Self-pay | Admitting: Nurse Practitioner

## 2015-04-07 MED ORDER — POTASSIUM CHLORIDE CRYS ER 20 MEQ PO TBCR: 20.0000 meq | EXTENDED_RELEASE_TABLET | Freq: Every day | ORAL | Status: DC

## 2015-05-21 HISTORY — PX: TARSAL METATARSAL FUSION WITH WEIL OSTEOTOMY: SHX5676

## 2015-06-08 DIAGNOSIS — I10 Essential (primary) hypertension: Secondary | ICD-10-CM | POA: Diagnosis not present

## 2015-06-08 DIAGNOSIS — G8921 Chronic pain due to trauma: Secondary | ICD-10-CM | POA: Diagnosis not present

## 2015-06-08 MED FILL — oxyCODONE HCL 10 MG TABS: 10 | 30 days supply | Qty: 90 | Fill #0

## 2015-06-09 DIAGNOSIS — M2011 Hallux valgus (acquired), right foot: Secondary | ICD-10-CM | POA: Diagnosis not present

## 2015-07-12 DIAGNOSIS — R2689 Other abnormalities of gait and mobility: Secondary | ICD-10-CM | POA: Diagnosis not present

## 2015-07-12 DIAGNOSIS — M2021 Hallux rigidus, right foot: Secondary | ICD-10-CM | POA: Diagnosis not present

## 2015-07-17 MED FILL — POTASSIUM CL ER 20 MEQ TAB: 20 | 90 days supply | Qty: 90 | Fill #1

## 2015-07-17 MED FILL — TRIAMTERENE-HCTZ 37.5-25 MG: 37.5-25 | 90 days supply | Qty: 90 | Fill #1

## 2015-08-22 DIAGNOSIS — I10 Essential (primary) hypertension: Secondary | ICD-10-CM | POA: Diagnosis not present

## 2015-08-22 DIAGNOSIS — G8921 Chronic pain due to trauma: Secondary | ICD-10-CM | POA: Diagnosis not present

## 2015-08-22 MED FILL — oxyCODONE HCL 10 MG TABS: 10 | 30 days supply | Qty: 90 | Fill #0

## 2015-09-12 DIAGNOSIS — M2021 Hallux rigidus, right foot: Secondary | ICD-10-CM | POA: Diagnosis not present

## 2015-09-19 DIAGNOSIS — Z01818 Encounter for other preprocedural examination: Secondary | ICD-10-CM | POA: Diagnosis not present

## 2015-09-22 MED FILL — PROMETHAZINE 25 MG TABLET: 25 | 5 days supply | Qty: 30 | Fill #0

## 2015-09-25 DIAGNOSIS — M2021 Hallux rigidus, right foot: Secondary | ICD-10-CM | POA: Diagnosis not present

## 2015-09-29 DIAGNOSIS — M2021 Hallux rigidus, right foot: Secondary | ICD-10-CM | POA: Diagnosis not present

## 2015-09-29 MED FILL — HYDROCODON-APAP 5-325: 5-325 | 5 days supply | Qty: 18 | Fill #0

## 2015-10-12 DIAGNOSIS — I10 Essential (primary) hypertension: Secondary | ICD-10-CM | POA: Diagnosis not present

## 2015-10-12 DIAGNOSIS — G8921 Chronic pain due to trauma: Secondary | ICD-10-CM | POA: Diagnosis not present

## 2015-10-12 MED FILL — oxyCODONE HCL 10 MG TABS: 10 | 30 days supply | Qty: 90 | Fill #0

## 2015-10-17 MED FILL — KLOR-CON M20 TABLET: 20 | 90 days supply | Qty: 90 | Fill #2

## 2015-10-17 MED FILL — TRIAMTERENE/HCTZ 37.5/25 TB: 37.5-25 | 90 days supply | Qty: 90 | Fill #2

## 2015-11-23 DIAGNOSIS — I10 Essential (primary) hypertension: Secondary | ICD-10-CM | POA: Diagnosis not present

## 2015-11-23 DIAGNOSIS — G8921 Chronic pain due to trauma: Secondary | ICD-10-CM | POA: Diagnosis not present

## 2015-11-24 MED FILL — oxyCODONE HCL 10 MG TABS: 10 | 30 days supply | Qty: 90 | Fill #0

## 2016-01-01 DIAGNOSIS — G8921 Chronic pain due to trauma: Secondary | ICD-10-CM | POA: Diagnosis not present

## 2016-01-01 DIAGNOSIS — I10 Essential (primary) hypertension: Secondary | ICD-10-CM | POA: Diagnosis not present

## 2016-01-01 MED FILL — oxyCODONE HCL 10 MG TABS: 10 | 30 days supply | Qty: 90 | Fill #0

## 2016-01-15 MED FILL — TRIAMTERENE/HCTZ 37.5/25 TB: 37.5-25 | 90 days supply | Qty: 90 | Fill #3

## 2016-01-15 MED FILL — KLOR-CON M20 TABLET: 20 | 90 days supply | Qty: 90 | Fill #3

## 2016-02-07 DIAGNOSIS — G8921 Chronic pain due to trauma: Secondary | ICD-10-CM | POA: Diagnosis not present

## 2016-02-07 DIAGNOSIS — I10 Essential (primary) hypertension: Secondary | ICD-10-CM | POA: Diagnosis not present

## 2016-02-07 MED FILL — oxyCODONE HCL 10 MG TABS: 10 | 30 days supply | Qty: 90 | Fill #0

## 2016-03-11 DIAGNOSIS — Z79891 Long term (current) use of opiate analgesic: Secondary | ICD-10-CM | POA: Diagnosis not present

## 2016-03-12 DIAGNOSIS — I10 Essential (primary) hypertension: Secondary | ICD-10-CM | POA: Diagnosis not present

## 2016-03-12 DIAGNOSIS — R52 Pain, unspecified: Secondary | ICD-10-CM | POA: Diagnosis not present

## 2016-03-12 DIAGNOSIS — G8929 Other chronic pain: Secondary | ICD-10-CM | POA: Diagnosis not present

## 2016-03-12 MED FILL — oxyCODONE HCL 10 MG TABS: 10 | 30 days supply | Qty: 90 | Fill #0

## 2016-04-15 DIAGNOSIS — I10 Essential (primary) hypertension: Secondary | ICD-10-CM | POA: Diagnosis not present

## 2016-04-15 DIAGNOSIS — R52 Pain, unspecified: Secondary | ICD-10-CM | POA: Diagnosis not present

## 2016-04-15 DIAGNOSIS — G8929 Other chronic pain: Secondary | ICD-10-CM | POA: Diagnosis not present

## 2016-04-15 MED FILL — oxyCODONE HCL 10 MG TABS: 10 | 30 days supply | Qty: 90 | Fill #0

## 2016-04-16 ENCOUNTER — Other Ambulatory Visit: Payer: Self-pay | Admitting: Cardiovascular Disease

## 2016-04-16 DIAGNOSIS — I10 Essential (primary) hypertension: Secondary | ICD-10-CM

## 2016-04-17 MED FILL — KLOR-CON M20 TABLET: 20 | 30 days supply | Qty: 30 | Fill #0

## 2016-04-17 MED FILL — TRIAMTERENE-HCTZ 37.5-25 MG: 37.5-25 | 90 days supply | Qty: 90 | Fill #0

## 2016-05-17 ENCOUNTER — Other Ambulatory Visit: Payer: Self-pay | Admitting: Cardiovascular Disease

## 2016-05-21 DIAGNOSIS — G8921 Chronic pain due to trauma: Secondary | ICD-10-CM | POA: Diagnosis not present

## 2016-05-21 DIAGNOSIS — I10 Essential (primary) hypertension: Secondary | ICD-10-CM | POA: Diagnosis not present

## 2016-05-21 MED FILL — KLOR-CON M20 TABLET: 20 | 30 days supply | Qty: 30 | Fill #0

## 2016-05-21 MED FILL — oxyCODONE HCL 10 MG TABS: 10 | 30 days supply | Qty: 90 | Fill #0

## 2016-05-29 NOTE — Progress Notes (Signed)
Cardiology Office Note    Date:  05/30/2016   ID:  Jeffrey Sutton, DOB Mar 21, 1971, MRN [id number]  PCP:  No PCP Per Patient  Cardiologist:  Dr. Acie Fredrickson  Chief Complaint: Medication refill  History of Present Illness:   Jeffrey Sutton is a 46 y.o. male with hx of HTN and tobacco smoking presents here for follow up.   The patient has been followed by Dr. Acie Fredrickson for high blood pressure. He was doing well when last seen 03/2015. Also hx of atypical chest pain.  He continues to smoke one and half of tobacco every day. Complains of intermittent left upper sharp chest pain when he is under a lot of stress and anxiety. This occurs once every few months and its been stable. Patient works at [employer] and does a lot of lifting and walking without any chest pain or dyspnea. However has intermittent shortness of breath due to tobacco smoking. Patient had a growth of bone on his right feet. S/p removal of bone. After surgery patient was taking a lot of narcotics. During that time patient had a episode of presyncope. He was awake all night, hasn't eaten  Anything and drunk a lot of beer and red bull. He was standing with his friend and suddenly felt dizzy and felt like going to pass out.  He sat down and eat something and symptoms resolved within a few minutes. Had a similar episode with similar situation in 3 weeks. He has changed his diet and now drinking a lot of water. No reoccurrence since then. Blood pressure running in 130s /90s at home. Today his BP is 142/82. States he is anxious.      Past Medical History:  Diagnosis Date  . Chest pain   . Hypertension     No past surgical history on file.  Current Medications: Prior to Admission medications   Medication Sig Start Date End Date Taking? Authorizing Provider  clomiPHENE (CLOMID) 50 MG tablet Take 50 mg by mouth daily.    Historical Provider, MD  potassium chloride SA (KLOR-CON M20) 20 MEQ tablet Take 1 tablet (20 mEq total) by  mouth daily. *Please keep upcoming appt for further refills* 05/21/16   Thayer Headings, MD  triamterene-hydrochlorothiazide (MAXZIDE-25) 37.5-25 MG tablet TAKE 1 TABLET BY MOUTH DAILY. 04/17/16   Thayer Headings, MD    Allergies:   Benadryl [diphenhydramine hcl] and Procaine hcl   Social History   Social History  . Marital status: Married    Spouse name: N/A  . Number of children: N/A  . Years of education: N/A   Social History Main Topics  . Smoking status: Current Every Day Smoker    Packs/day: 2.00    Types: Cigarettes  . Smokeless tobacco: Not on file  . Alcohol use No  . Drug use: No  . Sexual activity: Not on file   Other Topics Concern  . Not on file   Social History Narrative  . No narrative on file     Family History:  The patient's family history includes Cancer in his mother.   ROS:   Please see the history of present illness.    ROS All other systems reviewed and are negative.   PHYSICAL EXAM:   VS:  BP (!) 142/84   Pulse 84   Ht 5\' 7"  (1.702 m)   Wt 222 lb 12.8 oz (101.1 kg)   BMI 34.90 kg/m    GEN: Well nourished, well developed, in no acute  distress  HEENT: normal  Neck: no JVD, carotid bruits, or masses Cardiac: RRR; no murmurs, rubs, or gallops,no edema  Respiratory:  clear to auscultation bilaterally, normal work of breathing GI: soft, nontender, nondistended, + BS MS: no deformity or atrophy  Skin: warm and dry, no rash Neuro:  Alert and Oriented x 3, Strength and sensation are intact Psych: euthymic mood, full affect  Wt Readings from Last 3 Encounters:  05/30/16 222 lb 12.8 oz (101.1 kg)  04/06/15 214 lb 6.4 oz (97.3 kg)  01/14/14 219 lb 1.9 oz (99.4 kg)      Studies/Labs Reviewed:   EKG:  EKG is ordered today.  The ekg ordered today demonstrates NSR at rate of 84 bpm. No acute changes.   Recent Labs: No results found for requested labs within last 8760 hours.   Lipid Panel No results found for: CHOL, TRIG, HDL, CHOLHDL, VLDL,  LDLCALC, LDLDIRECT  Additional studies/ records that were reviewed today include:   As above    ASSESSMENT & PLAN:    1. HTN - Minimally elevated today to 142/82. This has been stable at home in 130s/ 90s range. Continue current dose of Maxide-25. Check BEMT today. Keep log of BP.  2. Pre-syncope - As described above. No reoccurrence since lifestyle changes. He will let Jeffrey Sutton know if further episode. No work up needed currently.   3. Tobacco smoking - Advised cessation. Education given. Seems patient is not interested in quitting tobacco smoking.    Medication Adjustments/Labs and Tests Ordered: Current medicines are reviewed at length with the patient today.  Concerns regarding medicines are outlined above.  Medication changes, Labs and Tests ordered today are listed in the Patient Instructions below. Patient Instructions  Your physician recommends that you continue on your current medications as directed. Please refer to the Current Medication list given to you today.   Your physician recommends that you return for lab work in:  TODAY  BMET   Your physician wants you to follow-up in: [place] will receive a reminder letter in the mail two months in advance. If you don't receive a letter, please call our office to schedule the follow-up appointment.    Jarrett Soho, [place]  05/30/2016 10:02 AM    [place], [place], [place]  [address] Phone: [phone]; Fax: [phone]

## 2016-05-30 ENCOUNTER — Ambulatory Visit (INDEPENDENT_AMBULATORY_CARE_PROVIDER_SITE_OTHER): Payer: 59 | Admitting: Physician Assistant

## 2016-05-30 ENCOUNTER — Encounter (INDEPENDENT_AMBULATORY_CARE_PROVIDER_SITE_OTHER): Payer: Self-pay

## 2016-05-30 VITALS — BP 142/84 | HR 84 | Ht 67.0 in | Wt 222.8 lb

## 2016-05-30 DIAGNOSIS — Z79899 Other long term (current) drug therapy: Secondary | ICD-10-CM

## 2016-05-30 DIAGNOSIS — Z720 Tobacco use: Secondary | ICD-10-CM

## 2016-05-30 DIAGNOSIS — R079 Chest pain, unspecified: Secondary | ICD-10-CM

## 2016-05-30 DIAGNOSIS — I10 Essential (primary) hypertension: Secondary | ICD-10-CM | POA: Diagnosis not present

## 2016-05-30 DIAGNOSIS — Z0000 Encounter for general adult medical examination without abnormal findings: Secondary | ICD-10-CM

## 2016-05-30 DIAGNOSIS — R55 Syncope and collapse: Secondary | ICD-10-CM

## 2016-05-30 MED ORDER — POTASSIUM CHLORIDE CRYS ER 20 MEQ PO TBCR: 20.0000 meq | EXTENDED_RELEASE_TABLET | Freq: Every day | ORAL | 11 refills | Status: DC

## 2016-05-30 NOTE — Patient Instructions (Signed)
Your physician recommends that you continue on your current medications as directed. Please refer to the Current Medication list given to you today.   Your physician recommends that you return for lab work in:  TODAY  BMET   Your physician wants you to follow-up in: [place] will receive a reminder letter in the mail two months in advance. If you don't receive a letter, please call our office to schedule the follow-up appointment.

## 2016-05-31 LAB — BASIC METABOLIC PANEL WITH GFR
BUN/Creatinine Ratio: 18 (ref 9–20)
BUN: 18 mg/dL (ref 6–24)
CO2: 24 mmol/L (ref 18–29)
Calcium: 9.6 mg/dL (ref 8.7–10.2)
Chloride: 96 mmol/L (ref 96–106)
Creatinine, Ser: 0.98 mg/dL (ref 0.76–1.27)
GFR calc Af Amer: 107 mL/min/1.73
GFR calc non Af Amer: 93 mL/min/1.73
Glucose: 92 mg/dL (ref 65–99)
Potassium: 4.2 mmol/L (ref 3.5–5.2)
Sodium: 138 mmol/L (ref 134–144)

## 2016-06-13 ENCOUNTER — Ambulatory Visit: Payer: 59 | Admitting: Cardiovascular Disease

## 2016-06-20 MED FILL — KLOR-CON M20 TABLET: 20 | 30 days supply | Qty: 30 | Fill #0

## 2016-06-21 DIAGNOSIS — G8929 Other chronic pain: Secondary | ICD-10-CM | POA: Diagnosis not present

## 2016-06-21 DIAGNOSIS — R52 Pain, unspecified: Secondary | ICD-10-CM | POA: Diagnosis not present

## 2016-06-21 DIAGNOSIS — I10 Essential (primary) hypertension: Secondary | ICD-10-CM | POA: Diagnosis not present

## 2016-06-21 MED FILL — oxyCODONE HCL 10 MG TABS: 10 | 30 days supply | Qty: 90 | Fill #0

## 2016-07-12 MED FILL — KLOR-CON M20 TABLET: 20 | 30 days supply | Qty: 30 | Fill #1

## 2016-07-12 MED FILL — TRIAMTERENE-HCTZ 37.5-25 MG: 37.5-25 | 90 days supply | Qty: 90 | Fill #1

## 2016-07-25 DIAGNOSIS — G8929 Other chronic pain: Secondary | ICD-10-CM | POA: Diagnosis not present

## 2016-07-25 DIAGNOSIS — I10 Essential (primary) hypertension: Secondary | ICD-10-CM | POA: Diagnosis not present

## 2016-07-25 DIAGNOSIS — G8921 Chronic pain due to trauma: Secondary | ICD-10-CM | POA: Diagnosis not present

## 2016-07-25 DIAGNOSIS — F17200 Nicotine dependence, unspecified, uncomplicated: Secondary | ICD-10-CM | POA: Diagnosis not present

## 2016-07-25 MED FILL — oxyCODONE HCL 10 MG TABS: 10 | 30 days supply | Qty: 90 | Fill #0

## 2016-08-20 MED FILL — KLOR-CON M20 TABLET: 20 | 30 days supply | Qty: 30 | Fill #2

## 2016-08-22 DIAGNOSIS — G8929 Other chronic pain: Secondary | ICD-10-CM | POA: Diagnosis not present

## 2016-08-22 DIAGNOSIS — I10 Essential (primary) hypertension: Secondary | ICD-10-CM | POA: Diagnosis not present

## 2016-08-22 DIAGNOSIS — G8921 Chronic pain due to trauma: Secondary | ICD-10-CM | POA: Diagnosis not present

## 2016-08-22 DIAGNOSIS — F17200 Nicotine dependence, unspecified, uncomplicated: Secondary | ICD-10-CM | POA: Diagnosis not present

## 2016-08-22 MED FILL — oxyCODONE HCL 10 MG TABS: 10 | 30 days supply | Qty: 90 | Fill #0

## 2016-09-17 MED FILL — POTASSIUM CL ER 20 MEQ TABL: 20 | 90 days supply | Qty: 90 | Fill #3

## 2016-09-23 MED FILL — oxyCODONE HCL 10 MG TABS: 10 | 30 days supply | Qty: 90 | Fill #0

## 2016-10-15 MED FILL — TRIAMTERENE-HCTZ 37.5-25 MG: 37.5-25 | 90 days supply | Qty: 90 | Fill #2

## 2016-10-22 MED FILL — oxyCODONE HCL 10 MG TABS: 10 | 30 days supply | Qty: 90 | Fill #0

## 2016-11-21 MED FILL — oxyCODONE HCL 10 MG TABS: 10 | 30 days supply | Qty: 90 | Fill #0

## 2016-12-17 MED FILL — POTASSIUM CL ER 20 MEQ TABL: 20 | 90 days supply | Qty: 90 | Fill #4

## 2016-12-24 DIAGNOSIS — Z79891 Long term (current) use of opiate analgesic: Secondary | ICD-10-CM | POA: Diagnosis not present

## 2016-12-24 MED FILL — oxyCODONE HCL 10 MG TABS: 10 | 30 days supply | Qty: 90 | Fill #0

## 2017-01-22 MED FILL — TRIAMTERENE-HCTZ 37.5-25 MG: 37.5-25 | 90 days supply | Qty: 90 | Fill #3

## 2017-01-22 MED FILL — oxyCODONE HCL 10 MG TABS: 10 | 30 days supply | Qty: 90 | Fill #0

## 2017-02-21 MED FILL — oxyCODONE HCL 10 MG TABS: 10 | 30 days supply | Qty: 90 | Fill #0

## 2017-03-26 ENCOUNTER — Other Ambulatory Visit: Payer: Self-pay | Admitting: Cardiovascular Disease

## 2017-03-26 DIAGNOSIS — Z79891 Long term (current) use of opiate analgesic: Secondary | ICD-10-CM | POA: Diagnosis not present

## 2017-03-26 DIAGNOSIS — I10 Essential (primary) hypertension: Secondary | ICD-10-CM

## 2017-03-26 MED FILL — POTASSIUM CL ER 20 MEQ TABL: 20 | 90 days supply | Qty: 90 | Fill #5

## 2017-03-26 MED FILL — oxyCODONE HCL 10 MG TABS: 10 | 30 days supply | Qty: 90 | Fill #0

## 2017-04-25 MED FILL — TRIAMTERENE/HCTZ 37.5/25 TB: 37.5-25 | 90 days supply | Qty: 90 | Fill #0

## 2017-05-02 ENCOUNTER — Other Ambulatory Visit: Payer: Self-pay | Admitting: Cardiovascular Disease

## 2017-05-02 DIAGNOSIS — I10 Essential (primary) hypertension: Secondary | ICD-10-CM

## 2017-06-24 ENCOUNTER — Other Ambulatory Visit: Payer: Self-pay | Admitting: Physician Assistant

## 2017-06-24 MED FILL — POTASSIUM CL ER 20 MEQ TABL: 20 | 30 days supply | Qty: 30 | Fill #0

## 2017-07-11 ENCOUNTER — Encounter: Payer: Self-pay | Admitting: *Deleted

## 2017-07-18 ENCOUNTER — Ambulatory Visit (INDEPENDENT_AMBULATORY_CARE_PROVIDER_SITE_OTHER): Payer: 59 | Admitting: Cardiovascular Disease

## 2017-07-18 ENCOUNTER — Encounter (INDEPENDENT_AMBULATORY_CARE_PROVIDER_SITE_OTHER): Payer: Self-pay

## 2017-07-18 ENCOUNTER — Encounter: Payer: Self-pay | Admitting: Cardiovascular Disease

## 2017-07-18 VITALS — BP 122/86 | HR 71 | Ht 67.0 in | Wt 208.0 lb

## 2017-07-18 DIAGNOSIS — I10 Essential (primary) hypertension: Secondary | ICD-10-CM | POA: Diagnosis not present

## 2017-07-18 DIAGNOSIS — Z125 Encounter for screening for malignant neoplasm of prostate: Secondary | ICD-10-CM

## 2017-07-18 LAB — BASIC METABOLIC PANEL
BUN / CREAT RATIO: 14 (ref 9–20)
BUN: 14 mg/dL (ref 6–24)
CO2: 25 mmol/L (ref 20–29)
CREATININE: 1.03 mg/dL (ref 0.76–1.27)
Calcium: 9.4 mg/dL (ref 8.7–10.2)
Chloride: 99 mmol/L (ref 96–106)
GFR calc Af Amer: 100 mL/min/{1.73_m2} (ref >59)
GFR, EST NON AFRICAN AMERICAN: 87 mL/min/{1.73_m2} (ref >59)
GLUCOSE: 87 mg/dL (ref 65–99)
POTASSIUM: 4.1 mmol/L (ref 3.5–5.2)
Sodium: 139 mmol/L (ref 134–144)

## 2017-07-18 LAB — PSA: Prostate Specific Ag, Serum: 1.2 ng/mL (ref 0.0–4.0)

## 2017-07-18 NOTE — Progress Notes (Signed)
Jeffrey Sutton Date of Birth  [birth year]       Jeffrey Sutton      [address] [place]    [place], [place]  [address]     [phone]         Fax  [phone]       Problem List: 1. Hypertension  Previous notes :      November 18, 2012:  Jeffrey Sutton presents today- he had a severe episode of pleurisy about 1 month ago. The pain was severe and only occurred with a deep breath.  He has a chronic cough.  No hemoptysis. No fever.  The pain lasted for 1 hour followed by some very mild pains.    He has felt well.  He has not been sleeping well - perhaps 4 hours a night.   He is active and exercise regularly.    January 14, 2014:  Jeffrey Sutton is doing well.  He is now doing tatoo work full time.  Marland Kitchen  He occasionally has some atypical CP .  Just lasted 1-2 seconds.   Exercising some   Nov. 17, 2016:  Traveling quite a bit.  Exercising some   July 18, 2017:  Last fall, had an anxiety attack,  Had some stabbing chest pain .  Has occurred a second time  Exercised regularly  Has lost 30 lbs.   Current Outpatient Medications on File Prior to Visit  Medication Sig Dispense Refill  . Oxycodone HCl 10 MG TABS Take 1 tablet by mouth daily as needed for pain.  0  . potassium chloride SA (K-DUR,KLOR-CON) 20 MEQ tablet Take 1 tablet (20 mEq total) by mouth daily. Please keep upcoming appt for future refills. Thank you 30 tablet 1  . triamterene-hydrochlorothiazide (MAXZIDE-25) 37.5-25 MG tablet TAKE 1 TABLET BY MOUTH DAILY. 90 tablet 0   No current facility-administered medications on file prior to visit.     Allergies  Allergen Reactions  . Benadryl [Diphenhydramine Hcl]     unknown  . Procaine Hcl     unknown    Past Medical History:  Diagnosis Date  . Chest pain   . Hypertension     History reviewed. No pertinent surgical history.  Social History   Tobacco Use  Smoking Status Current Every Day Smoker  . Packs/day: 2.00  . Types: Cigarettes    Social History     Substance and Sexual Activity  Alcohol Use No    Family History  Problem Relation Age of Onset  . Cancer Mother     Reviw of Systems:  Reviewed in the HPI.  All other systems are negative.  Physical Exam: Blood pressure 122/86, pulse 71, height 5\' 7"  (1.702 m), weight 208 lb (94.3 kg).  GEN:  Well nourished, well developed in no acute distress HEENT: Normal NECK: No JVD; No carotid bruits LYMPHATICS: No lymphadenopathy CARDIAC: RR, no murmurs, rubs, gallops RESPIRATORY:  Clear to auscultation without rales, wheezing or rhonchi  ABDOMEN: Soft, non-tender, non-distended MUSCULOSKELETAL:  No edema; No deformity  SKIN: multiple tatoos NEUROLOGIC:  Alert and oriented x 3    ECG:  July 18, 2017:   NSR with sinus arrhytmia.  HR  = 71    Assessment / Plan:   1. Essential HTN:    doing very well.   BP is well controlled.   Encouraged exercise    2. Smoking:   Encouraged him to stop     Mertie Moores, MD  07/18/2017 8:31 AM  [place],  [place], [place]  [address] Pager [phone] Phone: [phone]; Fax: [phone]

## 2017-07-18 NOTE — Patient Instructions (Signed)
Medication Instructions:  Your physician recommends that you continue on your current medications as directed. Please refer to the Current Medication list given to you today.   Labwork: TODAY - PSA, BMET   Testing/Procedures: None Ordered   Follow-Up: Your physician wants you to follow-up in: 1 year with Dr. Acie Fredrickson.  You will receive a reminder letter in the mail two months in advance. If you don't receive a letter, please call our office to schedule the follow-up appointment.   If you need a refill on your cardiac medications before your next appointment, please call your pharmacy.   Thank you for choosing [facility]! Christen Bame, RN [phone]

## 2017-07-24 ENCOUNTER — Other Ambulatory Visit: Payer: Self-pay | Admitting: Cardiovascular Disease

## 2017-07-24 DIAGNOSIS — I10 Essential (primary) hypertension: Secondary | ICD-10-CM

## 2017-07-24 MED FILL — KLOR-CON M20 TABLET: 20 | 30 days supply | Qty: 30 | Fill #1

## 2017-07-24 MED FILL — TRIAMTERENE-HCTZ 37.5-25 MG: 37.5-25 | 90 days supply | Qty: 90 | Fill #0

## 2017-07-25 ENCOUNTER — Telehealth: Payer: Self-pay | Admitting: Cardiovascular Disease

## 2017-07-25 NOTE — Telephone Encounter (Signed)
°*  STAT* If patient is at the pharmacy, call can be transferred to refill team.   1. Which medications need to be refilled? (please list name of each medication and dose if known) triamterene-hydrochlorothiazide (MAXZIDE-25) 37.5-25 MG tablet  2. Which pharmacy/location (including street and city if local pharmacy) is medication to be sent to?[place], [place].  3. Do they need a 30 day or 90 day supply? [place]

## 2017-08-21 ENCOUNTER — Other Ambulatory Visit: Payer: Self-pay | Admitting: Physician Assistant

## 2017-08-21 MED FILL — POTASSIUM CL ER 20 MEQ TABL: 20 | 90 days supply | Qty: 90 | Fill #0

## 2017-08-25 DIAGNOSIS — G894 Chronic pain syndrome: Secondary | ICD-10-CM | POA: Diagnosis not present

## 2017-09-19 DIAGNOSIS — H04123 Dry eye syndrome of bilateral lacrimal glands: Secondary | ICD-10-CM | POA: Diagnosis not present

## 2017-10-14 DIAGNOSIS — R5382 Chronic fatigue, unspecified: Secondary | ICD-10-CM | POA: Diagnosis not present

## 2017-10-14 DIAGNOSIS — M1990 Unspecified osteoarthritis, unspecified site: Secondary | ICD-10-CM | POA: Diagnosis not present

## 2017-10-14 DIAGNOSIS — I10 Essential (primary) hypertension: Secondary | ICD-10-CM | POA: Diagnosis not present

## 2017-10-14 DIAGNOSIS — E785 Hyperlipidemia, unspecified: Secondary | ICD-10-CM | POA: Diagnosis not present

## 2017-10-27 MED FILL — TRIAMTERENE-HCTZ 37.5-25 MG: 37.5-25 | 90 days supply | Qty: 90 | Fill #1

## 2017-11-25 MED FILL — POTASSIUM CL ER 20 MEQ TAB: 20 | 90 days supply | Qty: 90 | Fill #1

## 2017-12-16 DIAGNOSIS — Z79891 Long term (current) use of opiate analgesic: Secondary | ICD-10-CM | POA: Diagnosis not present

## 2017-12-16 DIAGNOSIS — G894 Chronic pain syndrome: Secondary | ICD-10-CM | POA: Diagnosis not present

## 2018-01-27 MED FILL — TRIAMTERENE/HCTZ 37.5/25 TB: 37.5-25 | 90 days supply | Qty: 90 | Fill #2

## 2018-02-12 DIAGNOSIS — Z79899 Other long term (current) drug therapy: Secondary | ICD-10-CM | POA: Diagnosis not present

## 2018-02-12 DIAGNOSIS — F1120 Opioid dependence, uncomplicated: Secondary | ICD-10-CM | POA: Diagnosis not present

## 2018-02-17 MED FILL — POTASSIUM CL ER 20 MEQ TAB: 20 | 90 days supply | Qty: 90 | Fill #2

## 2018-05-08 MED FILL — POTASSIUM CHLORIDE CRYS ER: 20 | 90 days supply | Qty: 90 | Fill #3

## 2018-05-08 MED FILL — TRIAMTERENE/HCTZ 37.5/25 TB: 37.5-25 | 90 days supply | Qty: 90 | Fill #3

## 2018-08-03 ENCOUNTER — Ambulatory Visit (HOSPITAL_COMMUNITY)
Admission: EM | Admit: 2018-08-03 | Discharge: 2018-08-03 | Disposition: A | Discharge status: Home / Self Care | Payer: 59 | Attending: Family Medicine | Admitting: Family Medicine

## 2018-08-03 ENCOUNTER — Other Ambulatory Visit: Payer: Self-pay

## 2018-08-03 ENCOUNTER — Encounter (HOSPITAL_COMMUNITY): Payer: Self-pay

## 2018-08-03 ENCOUNTER — Ambulatory Visit (INDEPENDENT_AMBULATORY_CARE_PROVIDER_SITE_OTHER): Payer: 59

## 2018-08-03 DIAGNOSIS — W312XXA Contact with powered woodworking and forming machines, initial encounter: Secondary | ICD-10-CM

## 2018-08-03 DIAGNOSIS — S61233A Puncture wound without foreign body of left middle finger without damage to nail, initial encounter: Secondary | ICD-10-CM

## 2018-08-03 DIAGNOSIS — S61213A Laceration without foreign body of left middle finger without damage to nail, initial encounter: Secondary | ICD-10-CM | POA: Diagnosis not present

## 2018-08-03 DIAGNOSIS — Z23 Encounter for immunization: Secondary | ICD-10-CM | POA: Diagnosis not present

## 2018-08-03 DIAGNOSIS — S61203A Unspecified open wound of left middle finger without damage to nail, initial encounter: Secondary | ICD-10-CM | POA: Diagnosis not present

## 2018-08-03 MED ORDER — TETANUS-DIPHTH-ACELL PERTUSSIS 5-2.5-18.5 LF-MCG/0.5 IM SUSP
0.5000 mL | Freq: Once | INTRAMUSCULAR | Status: AC
  Administered 2018-08-03: 0.5 mL via INTRAMUSCULAR

## 2018-08-03 MED ORDER — TETANUS-DIPHTH-ACELL PERTUSSIS 5-2.5-18.5 LF-MCG/0.5 IM SUSP
INTRAMUSCULAR | Status: AC
  Filled 2018-08-03: qty 0.5

## 2018-08-03 NOTE — ED Triage Notes (Signed)
Pt presents to [facility] for left middle finger laceration since today, pt states he cut finger on jigsaw.

## 2018-08-03 NOTE — Discharge Instructions (Addendum)
Wash daily with soap and water.  Keep covered and avoid touching to keep clean.  Ice, elevation ibuprofen for pain control.  Suture removal in ~10 days.  Return to be seen for any signs of infection- redness, swelling, thick drainage.

## 2018-08-04 ENCOUNTER — Other Ambulatory Visit: Payer: Self-pay | Admitting: Cardiovascular Disease

## 2018-08-04 ENCOUNTER — Other Ambulatory Visit: Payer: Self-pay | Admitting: Physician Assistant

## 2018-08-04 DIAGNOSIS — S61213A Laceration without foreign body of left middle finger without damage to nail, initial encounter: Secondary | ICD-10-CM | POA: Diagnosis not present

## 2018-08-04 DIAGNOSIS — I10 Essential (primary) hypertension: Secondary | ICD-10-CM

## 2018-08-04 MED FILL — POTASSIUM CHLORIDE CRYS ER: 20 | 90 days supply | Qty: 90 | Fill #0

## 2018-08-04 MED FILL — TRIAMTERENE/HCTZ 37.5/25 TB: 37.5-25 | 90 days supply | Qty: 90 | Fill #0

## 2018-08-04 NOTE — ED Provider Notes (Signed)
[place]    CSN: [id number] Arrival date & time: 08/03/18  [place]     History   Chief Complaint Chief Complaint  Patient presents with  . Laceration    HPI Jeffrey Sutton is a 48 y.o. male.   Jeffrey Sutton presents with complaints of wound to left hand middle finger which he obtained while using a jigsaw doing a home repair. He has some numbness surrounding wound. No loss of ROM of the finger. No recent tetanus. He hasn't yet cleansed the wound. Moderate pain. No active bleeding. No nail involvement. Hx of htn.    ROS per HPI, negative if not otherwise mentioned.      Past Medical History:  Diagnosis Date  . Chest pain   . Hypertension     Patient Active Problem List   Diagnosis Date Noted  . Hypertension   . Chest pain     History reviewed. No pertinent surgical history.     Home Medications    Prior to Admission medications   Medication Sig Start Date End Date Taking? Authorizing Provider  KLOR-CON M20 20 MEQ tablet TAKE 1 TABLET (20 MEQ TOTAL) BY MOUTH DAILY 08/21/17  Yes Bhagat, Bhavinkumar, PA  Oxycodone HCl 10 MG TABS Take 1 tablet by mouth daily as needed for pain. 07/01/17  Yes [provider]  triamterene-hydrochlorothiazide (MAXZIDE-25) 37.5-25 MG tablet TAKE 1 TABLET BY MOUTH DAILY. 07/24/17  Yes Nahser, Wonda Cheng, MD    Family History Family History  Problem Relation Age of Onset  . Cancer Mother     Social History Social History   Tobacco Use  . Smoking status: Current Every Day Smoker    Packs/day: 2.00    Types: Cigarettes  . Smokeless tobacco: Never Used  Substance Use Topics  . Alcohol use: No  . Drug use: No     Allergies   Procaine hcl   Review of Systems Review of Systems   Physical Exam Triage Vital Signs ED Triage Vitals  Enc Vitals Group     BP 08/03/18 1904 129/86     Pulse Rate 08/03/18 1904 88     Resp 08/03/18 1904 17     Temp 08/03/18 1904 98.3 F (36.8 C)     Temp Source 08/03/18 1904  Oral     SpO2 --      Weight --      Height --      Head Circumference --      Peak Flow --      Pain Score 08/03/18 1905 6     Pain Loc --      Pain Edu? --      Excl. in [place]? --    No data found.  Updated Vital Signs BP 129/86 (BP Location: Right Arm)   Pulse 88   Temp 98.3 F (36.8 C) (Oral)   Resp 17   Physical Exam Constitutional:      Appearance: He is well-developed.  Cardiovascular:     Rate and Rhythm: Normal rate and regular rhythm.  Pulmonary:     Effort: Pulmonary effort is normal.     Breath sounds: Normal breath sounds.  Musculoskeletal:       Hands:  Skin:    General: Skin is warm and dry.  Neurological:     Mental Status: He is alert and oriented to person, place, and time.      UC Treatments / Results  Labs (all labs ordered are listed, but only abnormal  results are displayed) Labs Reviewed - No data to display  EKG None  Radiology Dg Finger Middle Left  Result Date: 08/03/2018 CLINICAL DATA:  Puncture wound to left middle finger EXAM: LEFT MIDDLE FINGER 2+V COMPARISON:  None. FINDINGS: There is no evidence of fracture or dislocation. There is no evidence of arthropathy or other focal bone abnormality. Soft tissues are unremarkable. No radiopaque foreign body. IMPRESSION: Negative. Electronically Signed   By: Rolm Baptise M.D.   On: 08/03/2018 19:57    Procedures Laceration Repair Date/Time: 08/04/2018 2:00 PM Performed by: Zigmund Gottron, NP Authorized by: Vanessa Kick, MD   Consent:    Consent obtained:  Verbal   Consent given by:  Patient   Risks discussed:  Infection, pain, poor cosmetic result, need for additional repair and poor wound healing   Alternatives discussed:  No treatment and observation Anesthesia (see MAR for exact dosages):    Anesthesia method:  Nerve block   Block needle gauge:  27 G   Block anesthetic:  Lidocaine 2% w/o epi   Block injection procedure:  Anatomic landmarks identified, introduced needle,  incremental injection and anatomic landmarks palpated   Block outcome:  Incomplete block (some sensation remained to distal finger ) Laceration details:    Location:  Finger   Finger location:  L long finger   Length (cm):  0.4   Depth (mm):  2 Repair type:    Repair type:  Simple Pre-procedure details:    Preparation:  Patient was prepped and draped in usual sterile fashion Exploration:    Wound exploration: wound explored through full range of motion     Wound extent: no foreign bodies/material noted, no muscle damage noted, no nerve damage noted, no tendon damage noted, no underlying fracture noted and no vascular damage noted     Contaminated: no   Treatment:    Area cleansed with:  Betadine and soap and water   Amount of cleaning:  Standard Skin repair:    Repair method:  Sutures   Suture size:  5-0   Suture material:  Nylon   Suture technique:  Simple interrupted   Number of sutures:  1 Approximation:    Approximation:  Close Post-procedure details:    Dressing:  Adhesive bandage   Patient tolerance of procedure:  Tolerated well, no immediate complications Comments:     1 suture due to protrusion of underlying tissue from puncture wound; close approximation; xrays reviewed prior   (including critical care time)  Medications Ordered in UC Medications  Tdap (BOOSTRIX) injection 0.5 mL (0.5 mLs Intramuscular Given 08/03/18 1943)    Initial Impression / Assessment and Plan / UC Course  I have reviewed the triage vital signs and the nursing notes.  Pertinent labs & imaging results that were available during my care of the patient were reviewed by me and considered in my medical decision making (see chart for details).     tdap updated; 1 suture to puncture type wound; the other two areas of open skin abrasions cleansed and left open without indication for closure. Wound care education provided. Return precautions provided. Patient verbalized understanding and agreeable  to plan.    Final Clinical Impressions(s) / UC Diagnoses   Final diagnoses:  Laceration of left middle finger without foreign body without damage to nail, initial encounter     Discharge Instructions     Wash daily with soap and water.  Keep covered and avoid touching to keep clean.  Ice, elevation ibuprofen for  pain control.  Suture removal in ~10 days.  Return to be seen for any signs of infection- redness, swelling, thick drainage.    ED Prescriptions    None     Controlled Substance Prescriptions [place] Controlled Substance Registry consulted? Not Applicable   Zigmund Gottron, NP 08/04/18 1406

## 2018-10-08 ENCOUNTER — Telehealth: Payer: Self-pay | Admitting: Physician Assistant

## 2018-10-08 NOTE — Telephone Encounter (Signed)
[email or web address] visit on 10/09/2018        Phone Call to obtain consent    -    10/08/2018           Virtual Visit Pre-Appointment Phone Call  "(Name), I am calling you today to discuss your upcoming appointment. We are currently trying to limit exposure to the virus that causes COVID-19 by seeing patients at home rather than in the office."  1. "What is the BEST phone number to call the day of the visit?" - include this in appointment notes  2. Do you have or have access to (through a family member/friend) a smartphone with video capability that we can use for your visit?" a. If yes - list this number in appt notes as cell (if different from BEST phone #) and list the appointment type as a VIDEO visit in appointment notes b. If no - list the appointment type as a PHONE visit in appointment notes  3. Confirm consent - "In the setting of the current Covid19 crisis, you are scheduled for a (phone or video) visit with your provider on (date) at (time).  Just as we do with many in-office visits, in order for you to participate in this visit, we must obtain consent.  If you'd like, I can send this to your mychart (if signed up) or email for you to review.  Otherwise, I can obtain your verbal consent now.  All virtual visits are billed to your insurance company just like a normal visit would be.  By agreeing to a virtual visit, we'd like you to understand that the technology does not allow for your provider to perform an examination, and thus may limit your provider's ability to fully assess your condition. If your provider identifies any concerns that need to be evaluated in person, we will make arrangements to do so.  Finally, though the technology is pretty good, we cannot assure that it will always work on either your or our end, and in the setting of a video visit, we may have to convert it to a phone-only visit.  In either situation, we cannot ensure that we have a secure  connection.  Are you willing to proceed?" STAFF: Did the patient verbally acknowledge consent to telehealth visit? Document YES/NO here: YES  4. Advise patient to be prepared - "Two hours prior to your appointment, go ahead and check your blood pressure, pulse, oxygen saturation, and your weight (if you have the equipment to check those) and write them all down. When your visit starts, your provider will ask you for this information. If you have an Apple Watch or Kardia device, please plan to have heart rate information ready on the day of your appointment. Please have a pen and paper handy nearby the day of the visit as well."  5. Give patient instructions for MyChart download to smartphone OR Doximity/[email or web address] as below if video visit (depending on what platform provider is using)  6. Inform patient they will receive a phone call 15 minutes prior to their appointment time (may be from unknown caller ID) so they should be prepared to answer    TELEPHONE CALL NOTE  Jeffrey Sutton has been deemed a candidate for a follow-up tele-health visit to limit community exposure during the Covid-19 pandemic. I spoke with the patient via phone to ensure availability of phone/video source, confirm preferred email & phone number, and discuss instructions and expectations.  I reminded Jeffrey Sutton to be  prepared with any vital sign and/or heart rhythm information that could potentially be obtained via home monitoring, at the time of his visit. I reminded Jeffrey Sutton to expect a phone call prior to his visit.  Thayer Headings 10/08/2018 3:10 PM   INSTRUCTIONS FOR DOWNLOADING THE MYCHART APP TO SMARTPHONE  - The patient must first make sure to have activated MyChart and know their login information - If Apple, go to [employer] and type in MyChart in the search bar and download the app. If Android, ask patient to go to [employer] and type in [place] in the search bar and download the app. The  app is free but as with any other app downloads, their phone may require them to verify saved payment information or Apple/Android password.  - The patient will need to then log into the app with their MyChart username and password, and select [facility] as their healthcare provider to link the account. When it is time for your visit, go to the MyChart app, find appointments, and click Begin Video Visit. Be sure to Select Allow for your device to access the Microphone and Camera for your visit. You will then be connected, and your provider will be with you shortly.  **If they have any issues connecting, or need assistance please contact MyChart service desk [phone])**  **If using a computer, in order to ensure the best quality for their visit they will need to use either of the following Internet Browsers: [employer], or Google Chrome**  IF USING DOXIMITY or DOXY.ME - The patient will receive a link just prior to their visit by text.     FULL LENGTH CONSENT FOR TELE-HEALTH VISIT   I hereby voluntarily request, consent and authorize [facility] and its employed or contracted physicians, physician assistants, nurse practitioners or other licensed health care professionals (the Practitioner), to provide me with telemedicine health care services (the Services") as deemed necessary by the treating Practitioner. I acknowledge and consent to receive the Services by the Practitioner via telemedicine. I understand that the telemedicine visit will involve communicating with the Practitioner through live audiovisual communication technology and the disclosure of certain medical information by electronic transmission. I acknowledge that I have been given the opportunity to request an in-person assessment or other available alternative prior to the telemedicine visit and am voluntarily participating in the telemedicine visit.  I understand that I have the right to withhold or withdraw  my consent to the use of telemedicine in the course of my care at any time, without affecting my right to future care or treatment, and that the Practitioner or I may terminate the telemedicine visit at any time. I understand that I have the right to inspect all information obtained and/or recorded in the course of the telemedicine visit and may receive copies of available information for a reasonable fee.  I understand that some of the potential risks of receiving the Services via telemedicine include:   Delay or interruption in medical evaluation due to technological equipment failure or disruption;  Information transmitted may not be sufficient (e.g. poor resolution of images) to allow for appropriate medical decision making by the Practitioner; and/or   In rare instances, security protocols could fail, causing a breach of personal health information.  Furthermore, I acknowledge that it is my responsibility to provide information about my medical history, conditions and care that is complete and accurate to the best of my ability. I acknowledge that Practitioner's advice, recommendations,  and/or decision may be based on factors not within their control, such as incomplete or inaccurate data provided by me or distortions of diagnostic images or specimens that may result from electronic transmissions. I understand that the practice of medicine is not an exact science and that Practitioner makes no warranties or guarantees regarding treatment outcomes. I acknowledge that I will receive a copy of this consent concurrently upon execution via email to the email address I last provided but may also request a printed copy by calling the office of [facility].    I understand that my insurance will be billed for this visit.   I have read or had this consent read to me.  I understand the contents of this consent, which adequately explains the benefits and risks of the Services being provided via  telemedicine.   I have been provided ample opportunity to ask questions regarding this consent and the Services and have had my questions answered to my satisfaction.  I give my informed consent for the services to be provided through the use of telemedicine in my medical care  By participating in this telemedicine visit I agree to the above.

## 2018-10-08 NOTE — Progress Notes (Signed)
Virtual Visit via Video Note   This visit type was conducted due to national recommendations for restrictions regarding the COVID-19 Pandemic (e.g. social distancing) in an effort to limit this patient's exposure and mitigate transmission in our community.  Due to his co-morbid illnesses, this patient is at least at moderate risk for complications without adequate follow up.  This format is felt to be most appropriate for this patient at this time.  All issues noted in this document were discussed and addressed.  A limited physical exam was performed with this format.  Please refer to the patient's chart for his consent to telehealth for [facility].   Date:  10/09/2018   ID:  Jeffrey Sutton, DOB 05-Feb-1971, MRN [id number]  Patient Location: Home Provider Location: Home  PCP:  Patient, No Pcp Per  Cardiologist:  Mertie Moores, MD   Electrophysiologist:  None   Evaluation Performed:  Follow-Up Visit  Chief Complaint:  Chest pain  History of Present Illness:    Jeffrey Sutton is a 48 y.o. male with hypertension, tobacco use.  He was last seen by Dr. Acie Fredrickson in 07/2017.    For the last 2 mos, he has had recurring R sided chest pain.  He has not had exertional symptoms and no associated shortness of breath.  He has not had syncope, pleuritic chest pain or chest pain with lying supine.  He is a smoker.  He has not had orthopnea, leg swelling or syncope.    The patient does not have symptoms concerning for COVID-19 infection (fever, chills, cough, or new shortness of breath).    Past Medical History:  Diagnosis Date  . Chest pain   . Hypertension    No past surgical history on file.   Current Meds  Medication Sig  . Oxycodone HCl 10 MG TABS Take 1 tablet by mouth daily as needed for pain.  . potassium chloride SA (K-DUR,KLOR-CON) 20 MEQ tablet TAKE 1 TABLET (20 MEQ TOTAL) BY MOUTH DAILY  . triamterene-hydrochlorothiazide (MAXZIDE-25) 37.5-25 MG tablet TAKE 1 TABLET BY  MOUTH DAILY.     Allergies:   Procaine hcl   Social History   Tobacco Use  . Smoking status: Current Every Day Smoker    Packs/day: 2.00    Types: Cigarettes  . Smokeless tobacco: Never Used  Substance Use Topics  . Alcohol use: No  . Drug use: No     Family Hx: The patient's family history includes Cancer in his mother.  ROS:   Please see the history of present illness.     All other systems reviewed and are negative.   Prior CV studies:   The following studies were reviewed today:  None   Labs/Other Tests and Data Reviewed:    EKG:  No ECG reviewed.  Recent Labs: No results found for requested labs within last 8760 hours.   Recent Lipid Panel No results found for: CHOL, TRIG, HDL, CHOLHDL, LDLCALC, LDLDIRECT  Wt Readings from Last 3 Encounters:  10/09/18 209 lb (94.8 kg)  07/18/17 208 lb (94.3 kg)  05/30/16 222 lb 12.8 oz (101.1 kg)     Objective:    Vital Signs:  BP 121/87   Pulse 85   Ht 5\' 8"  (1.727 m)   Wt 209 lb (94.8 kg)   BMI 31.78 kg/m    VITAL SIGNS:  reviewed GEN:  no acute distress EYES:  sclerae anicteric, EOMI - Extraocular Movements Intact RESPIRATORY:  normal respiratory effort, symmetric expansion NEURO:  alert and oriented x 3, no obvious focal deficit PSYCH:  normal affect  ASSESSMENT & PLAN:     Other chest pain He notes a recent hx of R sided chest pain off and on.  It is not necessarily related to exertion.  He is a smoker and has a FHx of CAD as well as a hx of HTN.  We discussed the pros and cons of a coronary CTA vs stress testing. Unfortunately, we are not able to do a routine exercise tolerance test right now due to COVID-19 restrictions.  Given his risk factors, I think he needs some testing to rule out ischemic heart disease.  I recommend proceeding with a Lexiscan Myoview.  I will also obtain a chest X-ray given his smoking hx.    -Lexiscan Myoview  -CXR  -FU in 6 weeks  Essential hypertension The patient's blood  pressure is controlled on his current regimen.  Continue current therapy.    Tobacco abuse Obtain chest X-ray given hx of smoking.  COVID-19 Education: The signs and symptoms of COVID-19 were discussed with the patient and how to seek care for testing (follow up with PCP or arrange E-visit).  The importance of social distancing was discussed today.  Time:   Today, I have spent 22 minutes with the patient with telehealth technology discussing the above problems.     Medication Adjustments/Labs and Tests Ordered: Current medicines are reviewed at length with the patient today.  Concerns regarding medicines are outlined above.   Tests Ordered: Orders Placed This Encounter  Procedures  . DG Chest 2 View  . MYOCARDIAL PERFUSION IMAGING    Medication Changes: No orders of the defined types were placed in this encounter.   Disposition:  Follow up 6-8 weeks  Signed, Richardson Dopp, PA-C  10/09/2018 1:42 PM    [facility]

## 2018-10-09 ENCOUNTER — Telehealth (INDEPENDENT_AMBULATORY_CARE_PROVIDER_SITE_OTHER): Payer: 59 | Admitting: Physician Assistant

## 2018-10-09 ENCOUNTER — Encounter: Payer: Self-pay | Admitting: Physician Assistant

## 2018-10-09 ENCOUNTER — Other Ambulatory Visit: Payer: Self-pay

## 2018-10-09 VITALS — BP 121/87 | HR 85 | Ht 68.0 in | Wt 209.0 lb

## 2018-10-09 DIAGNOSIS — Z720 Tobacco use: Secondary | ICD-10-CM

## 2018-10-09 DIAGNOSIS — I10 Essential (primary) hypertension: Secondary | ICD-10-CM

## 2018-10-09 DIAGNOSIS — R079 Chest pain, unspecified: Secondary | ICD-10-CM | POA: Diagnosis not present

## 2018-10-09 DIAGNOSIS — Z7189 Other specified counseling: Secondary | ICD-10-CM

## 2018-10-09 NOTE — Patient Instructions (Signed)
Medication Instructions:  Continue your current medications.  If you need a refill on your cardiac medications before your next appointment, please call your pharmacy.   Lab work: None  If you have labs (blood work) drawn today and your tests are completely normal, you will receive your results only by: Marland Kitchen MyChart Message (if you have MyChart) OR . A paper copy in the mail If you have any lab test that is abnormal or we need to change your treatment, we will call you to review the results.  Testing/Procedures: We will arrange:  Lexiscan Myoview (stress test)  Chest X ray  Follow-Up: At [facility], you and your health needs are our priority.  As part of our continuing mission to provide you with exceptional heart care, we have created designated Provider Care Teams.  These Care Teams include your primary Cardiologist (physician) and Advanced Practice Providers (APPs -  Physician Assistants and Nurse Practitioners) who all work together to provide you with the care you need, when you need it. You will need a follow up appointment in:  6 weeks.  Please call our office 2 months in advance to schedule this appointment.  You may see Mertie Moores, MD or Richardson Dopp, PA-C   Any Other Special Instructions Will Be Listed Below (If Applicable).

## 2018-10-19 ENCOUNTER — Encounter: Payer: Self-pay | Admitting: Physician Assistant

## 2018-10-28 ENCOUNTER — Telehealth (HOSPITAL_COMMUNITY): Payer: Self-pay | Admitting: *Deleted

## 2018-10-28 NOTE — Telephone Encounter (Signed)
Patient given detailed instructions per Myocardial Perfusion Study Information Sheet for the test on 10/30/18 at 7:30. Patient notified to arrive 15 minutes early and that it is imperative to arrive on time for appointment to keep from having the test rescheduled.  If you need to cancel or reschedule your appointment, please call the office within 24 hours of your appointment. . Patient verbalized understanding.Jeffrey Sutton

## 2018-10-30 ENCOUNTER — Other Ambulatory Visit: Payer: Self-pay

## 2018-10-30 ENCOUNTER — Ambulatory Visit (HOSPITAL_COMMUNITY): Payer: 59 | Attending: Internal Medicine

## 2018-10-30 DIAGNOSIS — R079 Chest pain, unspecified: Secondary | ICD-10-CM | POA: Diagnosis not present

## 2018-10-30 MED ORDER — TECHNETIUM TC 99M TETROFOSMIN IV KIT
31.8000 | PACK | Freq: Once | INTRAVENOUS | Status: AC | PRN
  Administered 2018-10-30: 31.8 via INTRAVENOUS
  Filled 2018-10-30: qty 32

## 2018-10-30 MED ORDER — TECHNETIUM TC 99M TETROFOSMIN IV KIT
11.0000 | PACK | Freq: Once | INTRAVENOUS | Status: AC | PRN
  Administered 2018-10-30: 11 via INTRAVENOUS
  Filled 2018-10-30: qty 11

## 2018-10-30 MED ORDER — REGADENOSON 0.4 MG/5ML IV SOLN
0.4000 mg | Freq: Once | INTRAVENOUS | Status: AC
  Administered 2018-10-30: 0.4 mg via INTRAVENOUS

## 2018-10-31 LAB — MYOCARDIAL PERFUSION IMAGING
LV dias vol: 97 mL (ref 62–150)
LV sys vol: 38 mL
Peak HR: 125 {beats}/min
Rest HR: 75 {beats}/min
SDS: 1
SRS: 0
SSS: 1
TID: 0.98

## 2018-11-01 ENCOUNTER — Encounter: Payer: Self-pay | Admitting: Physician Assistant

## 2018-11-13 ENCOUNTER — Other Ambulatory Visit: Payer: Self-pay | Admitting: Cardiovascular Disease

## 2018-11-13 ENCOUNTER — Other Ambulatory Visit: Payer: Self-pay | Admitting: Physician Assistant

## 2018-11-13 DIAGNOSIS — I10 Essential (primary) hypertension: Secondary | ICD-10-CM

## 2018-11-13 MED FILL — POTASSIUM CHLORIDE CRYS ER: 20 | 90 days supply | Qty: 90 | Fill #0

## 2018-11-13 MED FILL — TRIAMTERENE/HCTZ 37.5/25 TB: 37.5-25 | 90 days supply | Qty: 90 | Fill #0

## 2019-01-05 DIAGNOSIS — M1990 Unspecified osteoarthritis, unspecified site: Secondary | ICD-10-CM | POA: Diagnosis not present

## 2019-01-05 DIAGNOSIS — I10 Essential (primary) hypertension: Secondary | ICD-10-CM | POA: Diagnosis not present

## 2019-01-05 DIAGNOSIS — G4700 Insomnia, unspecified: Secondary | ICD-10-CM | POA: Diagnosis not present

## 2019-01-05 DIAGNOSIS — E876 Hypokalemia: Secondary | ICD-10-CM | POA: Diagnosis not present

## 2019-02-12 MED FILL — TRIAMTERENE-HCTZ 37.5-25 MG: 37.5-25 | 90 days supply | Qty: 90 | Fill #1

## 2019-02-12 MED FILL — POTASSIUM CHLORIDE CRYS ER: 20 | 90 days supply | Qty: 90 | Fill #1

## 2019-05-11 MED FILL — TRIAMTERENE-HCTZ 37.5-25 MG: 37.5-25 | 90 days supply | Qty: 90 | Fill #2

## 2019-07-07 MED FILL — POTASSIUM CHLORIDE CRYS ER: 20 | 90 days supply | Qty: 90 | Fill #2

## 2019-08-18 MED FILL — TRIAMTERENE/HCTZ 37.5/25 TB: 37.5-25 | 90 days supply | Qty: 90 | Fill #3

## 2019-10-05 MED FILL — POTASSIUM CHLORIDE CRYS ER: 20 | 90 days supply | Qty: 90 | Fill #3

## 2019-10-30 IMAGING — DX LEFT MIDDLE FINGER 2+V
3 series · 3 of 3 positions shown · non-contrast
Comparison: None.

CLINICAL DATA: Puncture wound to left middle finger

EXAM:
LEFT MIDDLE FINGER 2+V

[finger ap]
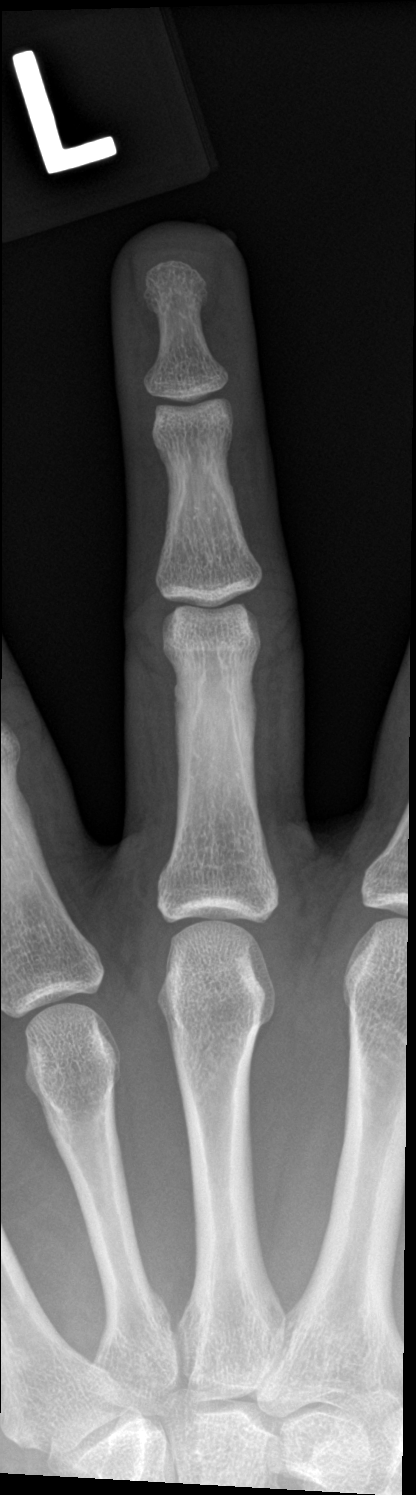

[finger obl]
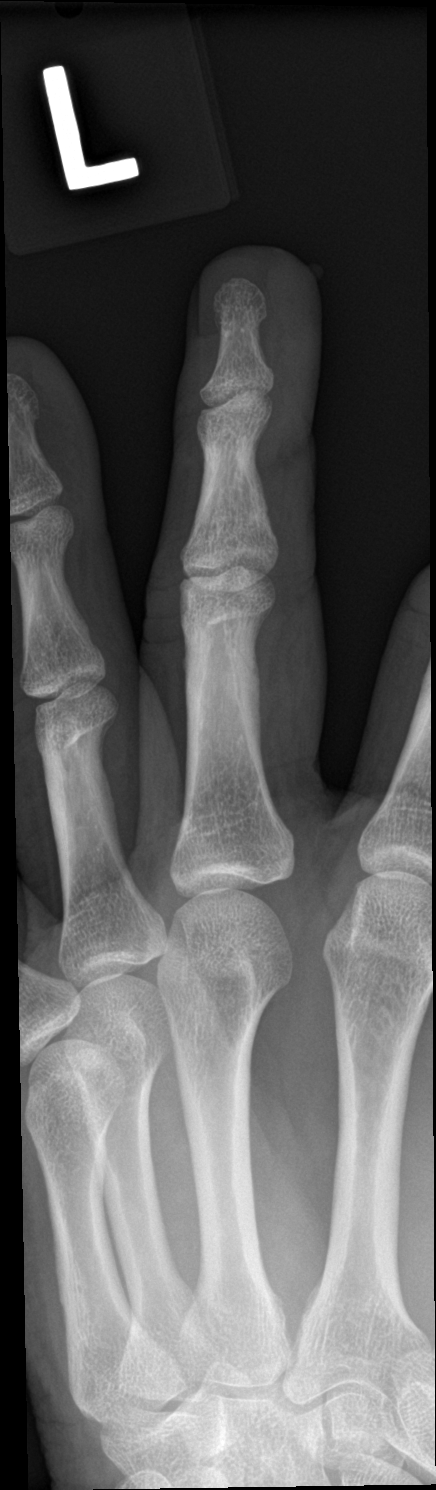

[finger lat]
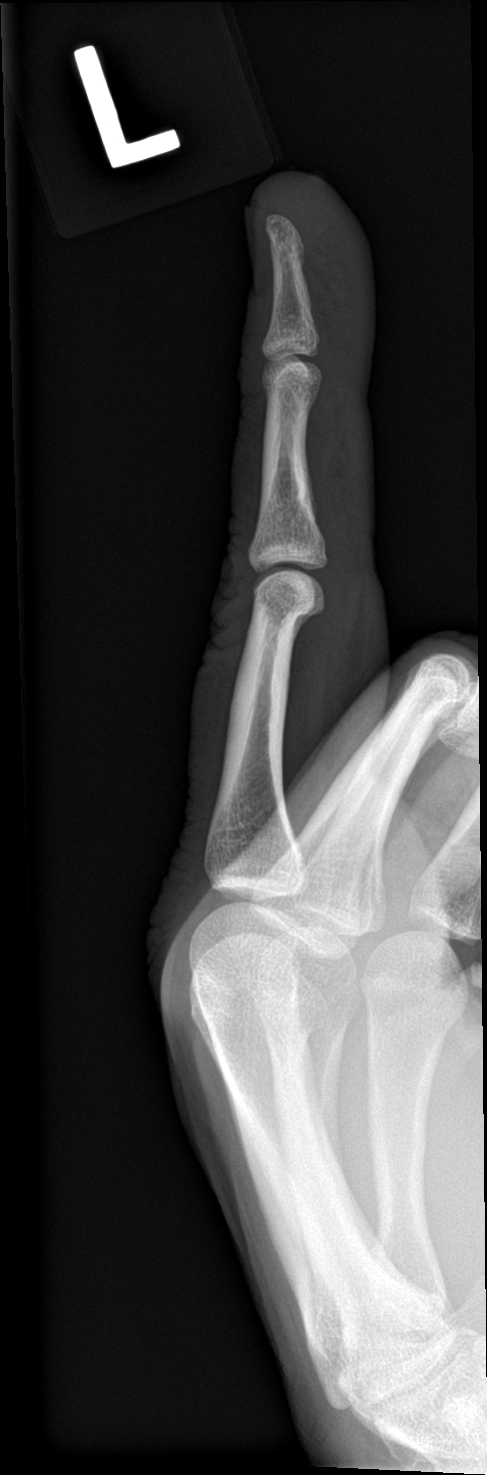

[3 of 3 positions shown; findings below may reference images not displayed]

FINDINGS: There is no evidence of fracture or dislocation. There is no
evidence of arthropathy or other focal bone abnormality. Soft
tissues are unremarkable. No radiopaque foreign body.
IMPRESSION: Negative.

## 2019-11-11 ENCOUNTER — Other Ambulatory Visit: Payer: Self-pay | Admitting: Cardiovascular Disease

## 2019-11-11 DIAGNOSIS — I10 Essential (primary) hypertension: Secondary | ICD-10-CM

## 2019-11-12 MED FILL — TRIAMTERENE-HCTZ 37.5-25 MG: 37.5-25 | 30 days supply | Qty: 30 | Fill #0

## 2019-12-13 DIAGNOSIS — H524 Presbyopia: Secondary | ICD-10-CM | POA: Diagnosis not present

## 2019-12-13 DIAGNOSIS — H52223 Regular astigmatism, bilateral: Secondary | ICD-10-CM | POA: Diagnosis not present

## 2019-12-13 DIAGNOSIS — H5213 Myopia, bilateral: Secondary | ICD-10-CM | POA: Diagnosis not present

## 2019-12-17 ENCOUNTER — Other Ambulatory Visit: Payer: Self-pay

## 2019-12-17 DIAGNOSIS — I10 Essential (primary) hypertension: Secondary | ICD-10-CM

## 2019-12-17 MED ORDER — TRIAMTERENE-HCTZ 37.5-25 MG PO TABS: 1.0000 | ORAL_TABLET | Freq: Every day | ORAL | 2 refills | Status: DC

## 2019-12-17 MED FILL — TRIAMTERENE-HCTZ 37.5-25 MG: 37.5-25 | 30 days supply | Qty: 30 | Fill #0

## 2020-01-14 ENCOUNTER — Telehealth: Payer: Self-pay | Admitting: Cardiovascular Disease

## 2020-01-14 ENCOUNTER — Other Ambulatory Visit: Payer: Self-pay | Admitting: Physician Assistant

## 2020-01-14 ENCOUNTER — Other Ambulatory Visit: Payer: Self-pay | Admitting: Cardiovascular Disease

## 2020-01-14 MED ORDER — POTASSIUM CHLORIDE CRYS ER 20 MEQ PO TBCR: 20.0000 meq | EXTENDED_RELEASE_TABLET | Freq: Once | ORAL | 0 refills | Status: DC

## 2020-01-14 MED FILL — TRIAMTERENE-HCTZ 37.5-25 MG: 37.5-25 | 30 days supply | Qty: 30 | Fill #1

## 2020-01-14 MED FILL — POTASSIUM CHLORIDE CRYS ER: 20 | 90 days supply | Qty: 90 | Fill #0

## 2020-01-14 NOTE — Telephone Encounter (Signed)
Refilled for 90 days no refills.  Pt has overdue appt scheduled with Dr. Acie Fredrickson for 01/25/20.

## 2020-01-14 NOTE — Telephone Encounter (Signed)
*  STAT* If patient is at the pharmacy, call can be transferred to refill team.   1. Which medications need to be refilled? (please list name of each medication and dose if known) potassium chloride SA (K-DUR) 20 MEQ tablet   2. Which pharmacy/location (including street and city if local pharmacy) is medication to be sent to? [place], [place].  3. Do they need a 30 day or 90 day supply? [place]

## 2020-01-24 ENCOUNTER — Encounter: Payer: Self-pay | Admitting: Cardiovascular Disease

## 2020-01-24 NOTE — Progress Notes (Signed)
This encounter was created in error - please disregard.

## 2020-01-25 ENCOUNTER — Encounter: Payer: 59 | Admitting: Cardiovascular Disease

## 2020-01-25 ENCOUNTER — Encounter: Payer: Self-pay | Admitting: Cardiovascular Disease

## 2020-01-25 NOTE — Progress Notes (Signed)
[name] Date of Birth  [birth year]       [name]      [address] [place]    [place], [place]  [address]     [phone]         Fax  [phone]       Problem List: 1. Hypertension  Previous notes   November 18, 2012:  Jeffrey Sutton presents today- he had a severe episode of pleurisy about 1 month ago. The pain was severe and only occurred with a deep breath.  He has a chronic cough.  No hemoptysis. No fever.  The pain lasted for 1 hour followed by some very mild pains.    He has felt well.  He has not been sleeping well - perhaps 4 hours a night.   He is active and exercise regularly.    January 14, 2014:  Jeffrey Sutton is doing well.  He is now doing tatoo work full time.  Marland Kitchen  He occasionally has some atypical CP .  Just lasted 1-2 seconds.   Exercising some   Nov. 17, 2016:  Traveling quite a bit.  Exercising some   July 18, 2017:  Last fall, had an anxiety attack,  Had some stabbing chest pain .  Has occurred a second time  Exercised regularly  Has lost 30 lbs.   Sept. 8., 2021 Jeffrey Sutton is seen today for follow up visit. Has a hx of pleuretic CP in the past.   Has a hx of HTN Still having this pleuretic CP  Worse with sitting down,  Worse with deep breath .  Not associated with exercise  Still smoking 1 ppd or less .  Averages 3 Monster drinks a day On the road, probably 6 monster drinks Does not drink alcohol Has gained weight .   Has not had covid vaccine.  Was in [place] in Nov. 2018.   Had a 2 week illness following that.  thnks he has already had it .     Current Outpatient Medications on File Prior to Visit  Medication Sig Dispense Refill  . Oxycodone HCl 10 MG TABS Take 1 tablet by mouth daily as needed for pain.  0  . potassium chloride SA (KLOR-CON) 20 MEQ tablet Take 1 tablet (20 mEq total) by mouth once for 1 dose. 90 tablet 0  . triamterene-hydrochlorothiazide (MAXZIDE-25) 37.5-25 MG tablet Take 1 tablet by mouth daily. 30 tablet 2    No current facility-administered medications on file prior to visit.    Allergies  Allergen Reactions  . Procaine Hcl     unknown    Past Medical History:  Diagnosis Date  . Chest pain   . Hypertension   . Myoview    Myoview 10/2018:  EF 60, no ischemic or scar; Low Risk    History reviewed. No pertinent surgical history.  Social History   Tobacco Use  Smoking Status Current Every Day Smoker  . Packs/day: 2.00  . Types: Cigarettes  Smokeless Tobacco Never Used    Social History   Substance and Sexual Activity  Alcohol Use No    Family History  Problem Relation Age of Onset  . Cancer Mother     Reviw of Systems:  Reviewed in the HPI.  All other systems are negative.  Physical Exam: There were no vitals taken for this visit.  GEN:  Well nourished, well developed in no acute distress HEENT: Normal NECK: No JVD; No carotid bruits LYMPHATICS: No lymphadenopathy CARDIAC:  RRR , no murmurs, rubs, gallops RESPIRATORY:  Clear to auscultation without rales, wheezing or rhonchi  ABDOMEN: Soft, non-tender, non-distended MUSCULOSKELETAL:  No edema; No deformity  SKIN: Warm and dry,  Multiple tatoos  NEUROLOGIC:  Alert and oriented x 3   ECG:  Sept. 8, 2021:  NSR at 74.  No ST or T wave abn.      Assessment / Plan:   1. Essential HTN:   BP is controlled.  Con current med s     2. Smoking:   Advised him to stop smoking   3.  Atypical CP .   Poeuretic.   Assoc with deep breath and sitting down.  May be due to his excessive caffeine intake, lack of sleep.   Advised him to greatly reduce the caffeine intake  Exercise  sleep better.    Mertie Moores, MD  01/25/2020 8:59 PM    [facility] [place],  [place], [place]  [address] Pager [phone] Phone: [phone]; Fax: [phone]

## 2020-01-26 ENCOUNTER — Encounter: Payer: Self-pay | Admitting: Cardiovascular Disease

## 2020-01-26 ENCOUNTER — Ambulatory Visit (INDEPENDENT_AMBULATORY_CARE_PROVIDER_SITE_OTHER): Payer: 59 | Admitting: Cardiovascular Disease

## 2020-01-26 ENCOUNTER — Other Ambulatory Visit: Payer: Self-pay

## 2020-01-26 VITALS — BP 134/88 | HR 74 | Ht 66.0 in | Wt 222.6 lb

## 2020-01-26 DIAGNOSIS — R0789 Other chest pain: Secondary | ICD-10-CM | POA: Diagnosis not present

## 2020-01-26 DIAGNOSIS — R079 Chest pain, unspecified: Secondary | ICD-10-CM

## 2020-01-26 NOTE — Patient Instructions (Signed)
Medication Instructions:  Your physician recommends that you continue on your current medications as directed. Please refer to the Current Medication list given to you today.  *If you need a refill on your cardiac medications before your next appointment, please call your pharmacy*   Lab Work: None  If you have labs (blood work) drawn today and your tests are completely normal, you will receive your results only by:  [place] (if you have MyChart) OR  A paper copy in the mail If you have any lab test that is abnormal or we need to change your treatment, we will call you to review the results.   Testing/Procedures: None   Follow-Up: At [facility], you and your health needs are our priority.  As part of our continuing mission to provide you with exceptional heart care, we have created designated Provider Care Teams.  These Care Teams include your primary Cardiologist (physician) and Advanced Practice Providers (APPs -  Physician Assistants and Nurse Practitioners) who all work together to provide you with the care you need, when you need it.  We recommend signing up for the patient portal called "MyChart".  Sign up information is provided on this After Visit Summary.  MyChart is used to connect with patients for Virtual Visits (Telemedicine).  Patients are able to view lab/test results, encounter notes, upcoming appointments, etc.  Non-urgent messages can be sent to your provider as well.   To learn more about what you can do with MyChart, go to [email or web address].    Your next appointment:   12 month(s)  The format for your next appointment:   In Person  Provider:   Mertie Moores, MD   Other Instructions None

## 2020-02-23 MED FILL — TRIAMTERENE-HCTZ 37.5-25 MG: 37.5-25 | 30 days supply | Qty: 30 | Fill #2

## 2020-03-21 ENCOUNTER — Other Ambulatory Visit: Payer: Self-pay | Admitting: Cardiovascular Disease

## 2020-03-21 DIAGNOSIS — I10 Essential (primary) hypertension: Secondary | ICD-10-CM

## 2020-03-21 MED FILL — TRIAMTERENE-HCTZ 37.5-25 MG: 37.5-25 | 90 days supply | Qty: 90 | Fill #0

## 2020-03-28 MED FILL — POTASSIUM CHLORIDE CRYS ER: 20 | 90 days supply | Qty: 90 | Fill #1

## 2020-06-16 DIAGNOSIS — M1990 Unspecified osteoarthritis, unspecified site: Secondary | ICD-10-CM | POA: Diagnosis not present

## 2020-06-16 DIAGNOSIS — E876 Hypokalemia: Secondary | ICD-10-CM | POA: Diagnosis not present

## 2020-06-16 DIAGNOSIS — R739 Hyperglycemia, unspecified: Secondary | ICD-10-CM | POA: Diagnosis not present

## 2020-06-16 DIAGNOSIS — G4700 Insomnia, unspecified: Secondary | ICD-10-CM | POA: Diagnosis not present

## 2020-06-16 DIAGNOSIS — I10 Essential (primary) hypertension: Secondary | ICD-10-CM | POA: Diagnosis not present

## 2020-06-20 ENCOUNTER — Other Ambulatory Visit: Payer: Self-pay | Admitting: Cardiovascular Disease

## 2020-06-20 DIAGNOSIS — I10 Essential (primary) hypertension: Secondary | ICD-10-CM

## 2020-06-21 ENCOUNTER — Other Ambulatory Visit: Payer: Self-pay | Admitting: Cardiovascular Disease

## 2020-06-21 MED FILL — TRIAMTERENE-HCTZ 37.5-25 MG: 37.5-25 | 90 days supply | Qty: 90 | Fill #0

## 2020-07-06 MED FILL — POTASSIUM CHLORIDE CRYS ER: 20 | 90 days supply | Qty: 90 | Fill #2

## 2020-09-21 ENCOUNTER — Other Ambulatory Visit (HOSPITAL_COMMUNITY): Payer: Self-pay

## 2020-09-21 MED FILL — Triamterene & Hydrochlorothiazide Tab 37.5-25 MG: ORAL | 90 days supply | Qty: 90 | Fill #0 | Status: AC

## 2020-09-21 MED FILL — Potassium Chloride Microencapsulated Crys ER Tab 20 mEq: ORAL | 90 days supply | Qty: 90 | Fill #0 | Status: AC

## 2020-12-21 MED FILL — Triamterene & Hydrochlorothiazide Tab 37.5-25 MG: ORAL | 90 days supply | Qty: 90 | Fill #1 | Status: AC

## 2020-12-22 ENCOUNTER — Other Ambulatory Visit (HOSPITAL_COMMUNITY): Payer: Self-pay

## 2021-01-15 ENCOUNTER — Other Ambulatory Visit: Payer: Self-pay | Admitting: Cardiovascular Disease

## 2021-01-15 ENCOUNTER — Other Ambulatory Visit (HOSPITAL_COMMUNITY): Payer: Self-pay

## 2021-01-15 MED ORDER — POTASSIUM CHLORIDE CRYS ER 20 MEQ PO TBCR
20.0000 meq | EXTENDED_RELEASE_TABLET | Freq: Every day | ORAL | 3 refills | Status: DC
  Filled 2021-01-15: qty 90, 90d supply, fill #0

## 2021-05-02 ENCOUNTER — Telehealth: Payer: Self-pay

## 2021-05-02 NOTE — Telephone Encounter (Signed)
SCANNED REFERRAL TO REFERRAL

## 2021-05-03 ENCOUNTER — Telehealth: Payer: Self-pay

## 2021-05-03 NOTE — Telephone Encounter (Signed)
NOTES SCANNED TO REFERRAL 

## 2021-06-20 ENCOUNTER — Ambulatory Visit: Payer: 59 | Admitting: Cardiovascular Disease

## 2021-06-22 ENCOUNTER — Other Ambulatory Visit (HOSPITAL_COMMUNITY): Payer: Self-pay

## 2021-06-22 MED ORDER — OLMESARTAN-AMLODIPINE-HCTZ 20-5-12.5 MG PO TABS
1.0000 | ORAL_TABLET | Freq: Every day | ORAL | 5 refills | Status: DC
Start: 2021-06-22 — End: 2021-08-02
  Filled 2021-06-22: qty 30, 30d supply, fill #0

## 2021-07-02 ENCOUNTER — Other Ambulatory Visit (HOSPITAL_COMMUNITY): Payer: Self-pay

## 2021-07-09 NOTE — Progress Notes (Signed)
Jeffrey Sutton Date of Birth  December 09, 1970       [facility]      [address] [place]    [place], [place]  [address]     [phone]         Fax  [phone]       Problem List: 1. Hypertension  Previous notes   November 18, 2012:  Jeffrey Sutton presents today- he had a severe episode of pleurisy about 1 month ago. The pain was severe and only occurred with a deep breath.  He has a chronic cough.  No hemoptysis. No fever.  The pain lasted for 1 hour followed by some very mild pains.    He has felt well.  He has not been sleeping well - perhaps 4 hours a night.   He is active and exercise regularly.    January 14, 2014:  Jeffrey Sutton is doing well.  He is now doing tatoo work full time.  Marland Kitchen  He occasionally has some atypical CP .  Just lasted 1-2 seconds.   Exercising some   Nov. 17, 2016:  Traveling quite a bit.  Exercising some   July 18, 2017:  Last fall, had an anxiety attack,  Had some stabbing chest pain .  Has occurred a second time  Exercised regularly  Has lost 30 lbs.   Sept. 8., 2021 Jeffrey Sutton is seen today for follow up visit. Has a hx of pleuretic CP in the past.   Has a hx of HTN Still having this pleuretic CP  Worse with sitting down,  Worse with deep breath .  Not associated with exercise  Still smoking 1 ppd or less .  Averages 3 Monster drinks a day On the road, probably 6 monster drinks Does not drink alcohol Has gained weight .   Has not had covid vaccine.  Was in [place] in Nov. 2018.   Had a 2 week illness following that.  thnks he has already had it .   Feb. 21, 2023 Jeffrey Sutton is seen today for follow up of his pleuretic CP  Hx of HTN Was last seen 2 years ago  BP is elevated.  Has not been on his BP meds for 3 months  Has been trying to eat low salt Still smoking some - has cut back  Has cut back on his energy drinks  No further CP  No COVID vaccines,  has not had covid to his knowledge     Current Outpatient Medications on File  Prior to Visit  Medication Sig Dispense Refill   oxyCODONE (OXY IR/ROXICODONE) 5 MG immediate release tablet Take 1 tablet by mouth as needed.     Olmesartan-amLODIPine-HCTZ 20-5-12.5 MG TABS Take 1 tablet by mouth daily. (Patient not taking: Reported on 07/10/2021) 30 tablet 5   potassium chloride SA (KLOR-CON) 20 MEQ tablet Take 1 tablet (20 mEq total) by mouth daily. (Patient not taking: Reported on 07/10/2021) 90 tablet 3   triamterene-hydrochlorothiazide (MAXZIDE-25) 37.5-25 MG tablet TAKE 1 TABLET BY MOUTH DAILY. 90 tablet 2   No current facility-administered medications on file prior to visit.    Allergies  Allergen Reactions   Procaine Hcl     unknown    Past Medical History:  Diagnosis Date   Chest pain    Hypertension    Myoview    Myoview 10/2018:  EF 60, no ischemic or scar; Low Risk    No past surgical history on file.  Social History  Tobacco Use  Smoking Status Every Day   Packs/day: 2.00   Types: Cigarettes  Smokeless Tobacco Never    Social History   Substance and Sexual Activity  Alcohol Use No    Family History  Problem Relation Age of Onset   Cancer Mother     Reviw of Systems:  Reviewed in the HPI.  All other systems are negative.  Physical Exam: Blood pressure (!) 150/98, pulse 80, height 5\' 7"  (1.702 m), weight 217 lb (98.4 kg), SpO2 99 %.  GEN:  moderately obese ,  well developed in no acute distress HEENT: Normal NECK: No JVD; No carotid bruits LYMPHATICS: No lymphadenopathy CARDIAC: RRR , no murmurs, rubs, gallops RESPIRATORY:  Clear to auscultation without rales, wheezing or rhonchi  ABDOMEN: Soft, non-tender, non-distended MUSCULOSKELETAL:  No edema; No deformity  SKIN: Warm and dry NEUROLOGIC:  Alert and oriented x 3    ECG:    July 10, 2021: Normal sinus rhythm at 80.  No ST or T wave changes.    Assessment / Plan:   1. Essential HTN:    He has been out of all of his medications.  He was previously fairly well  controlled on triamterene plus potassium supplement.  I think he might do better with spironolactone.  We will start him on to spironolactone 25 mg a day.  We will bring her back to hypertension clinic for titration of other medications.  His blood pressure is relatively high and I think that he will need additional agent such as an ARB and possibly amlodipine.   2. Smoking:    Encouraged him to work on smoking cessation.  3.  Atypical CP .    He has not had any further episodes of chest pain.      Mertie Moores, MD  07/10/2021 9:29 AM    [facility],  [place], [place]  [address] Pager [phone] Phone: [phone]; Fax: [phone]

## 2021-07-10 ENCOUNTER — Ambulatory Visit (INDEPENDENT_AMBULATORY_CARE_PROVIDER_SITE_OTHER): Payer: 59 | Admitting: Cardiovascular Disease

## 2021-07-10 ENCOUNTER — Encounter: Payer: Self-pay | Admitting: Cardiovascular Disease

## 2021-07-10 ENCOUNTER — Other Ambulatory Visit: Payer: Self-pay

## 2021-07-10 ENCOUNTER — Other Ambulatory Visit (HOSPITAL_COMMUNITY): Payer: Self-pay

## 2021-07-10 VITALS — BP 150/98 | HR 80 | Ht 67.0 in | Wt 217.0 lb

## 2021-07-10 DIAGNOSIS — I10 Essential (primary) hypertension: Secondary | ICD-10-CM

## 2021-07-10 MED ORDER — SPIRONOLACTONE 25 MG PO TABS
25.0000 mg | ORAL_TABLET | Freq: Every day | ORAL | 3 refills | Status: DC
  Filled 2021-07-10 – 2021-07-19 (×2): qty 90, 90d supply, fill #0
  Filled 2021-10-22: qty 90, 90d supply, fill #1
  Filled 2022-01-22: qty 90, 90d supply, fill #2
  Filled 2022-04-17: qty 90, 90d supply, fill #3

## 2021-07-10 NOTE — Patient Instructions (Signed)
Medication Instructions:  Your physician has recommended you make the following change in your medication:  1) START taking spironolactone 25 mg daily  *If you need a refill on your cardiac medications before your next appointment, please call your pharmacy*  Lab Work: IN 3 WEEKS: BMET If you have labs (blood work) drawn today and your tests are completely normal, you will receive your results only by: [place] (if you have MyChart) OR A paper copy in the mail If you have any lab test that is abnormal or we need to change your treatment, we will call you to review the results.  Follow-Up: At [facility], you and your health needs are our priority.  As part of our continuing mission to provide you with exceptional heart care, we have created designated Provider Care Teams.  These Care Teams include your primary Cardiologist (physician) and Advanced Practice Providers (APPs -  Physician Assistants and Nurse Practitioners) who all work together to provide you with the care you need, when you need it.  Follow up with PharmD in the Hypertension Clinic in 6 weeks  Your next appointment:   1 year(s)  The format for your next appointment:   In Person  Provider:   Mertie Moores, MD

## 2021-07-18 ENCOUNTER — Other Ambulatory Visit (HOSPITAL_COMMUNITY): Payer: Self-pay

## 2021-07-19 ENCOUNTER — Other Ambulatory Visit (HOSPITAL_COMMUNITY): Payer: Self-pay

## 2021-07-24 ENCOUNTER — Other Ambulatory Visit (HOSPITAL_COMMUNITY): Payer: Self-pay

## 2021-07-24 MED ORDER — OXYCODONE HCL 10 MG PO TABS
10.0000 mg | ORAL_TABLET | Freq: Four times a day (QID) | ORAL | 0 refills | Status: DC | PRN
Start: 2021-07-24 — End: 2021-08-21
  Filled 2021-07-24: qty 105, 27d supply, fill #0

## 2021-07-31 ENCOUNTER — Other Ambulatory Visit: Payer: 59

## 2021-07-31 ENCOUNTER — Other Ambulatory Visit: Payer: Self-pay

## 2021-07-31 DIAGNOSIS — I10 Essential (primary) hypertension: Secondary | ICD-10-CM

## 2021-08-01 LAB — BASIC METABOLIC PANEL
BUN/Creatinine Ratio: 12 (ref 9–20)
BUN: 13 mg/dL (ref 6–24)
CO2: 21 mmol/L (ref 20–29)
Calcium: 9.6 mg/dL (ref 8.7–10.2)
Chloride: 99 mmol/L (ref 96–106)
Creatinine, Ser: 1.11 mg/dL (ref 0.76–1.27)
Glucose: 128 mg/dL — ABNORMAL HIGH (ref 70–99)
Potassium: 4.3 mmol/L (ref 3.5–5.2)
Sodium: 135 mmol/L (ref 134–144)
eGFR: 81 mL/min/{1.73_m2} (ref >59)

## 2021-08-02 ENCOUNTER — Other Ambulatory Visit: Payer: Self-pay | Admitting: Podiatry

## 2021-08-02 ENCOUNTER — Ambulatory Visit (INDEPENDENT_AMBULATORY_CARE_PROVIDER_SITE_OTHER): Payer: 59 | Admitting: Podiatry

## 2021-08-02 ENCOUNTER — Encounter: Payer: Self-pay | Admitting: Podiatry

## 2021-08-02 ENCOUNTER — Ambulatory Visit (INDEPENDENT_AMBULATORY_CARE_PROVIDER_SITE_OTHER): Payer: 59

## 2021-08-02 ENCOUNTER — Other Ambulatory Visit: Payer: Self-pay

## 2021-08-02 DIAGNOSIS — M778 Other enthesopathies, not elsewhere classified: Secondary | ICD-10-CM | POA: Diagnosis not present

## 2021-08-02 NOTE — Progress Notes (Signed)
?Subjective:  ?Patient ID: Jeffrey Sutton, male    DOB: Jul 04, 1970,  MRN: [id number] ?HPI ?Chief Complaint  ?Patient presents with  ? Foot Pain  ?  1st MPJ right - injury 2014, saw Dr. Fritzi Mandes in 2017-did surgery to clean up joint, been painful intermittent since, now pain is constant and sharp, tried orthotics from her office as well, but never felt confortable  ? ? ?51 y.o. male presents with the above complaint.  ? ?ROS: Denies fever chills nausea vomiting muscle aches pains calf pain back pain chest pain shortness of breath. ? ?Past Medical History:  ?Diagnosis Date  ? Chest pain   ? Hypertension   ? Myoview   ? Myoview 10/2018:  EF 60, no ischemic or scar; Low Risk  ? ?No past surgical history on file. ? ?Current Outpatient Medications:  ?  naloxone (NARCAN) nasal spray 4 mg/0.1 mL, , Disp: , Rfl:  ?  Oxycodone HCl 10 MG TABS, Take 1 tablet (10 mg total) by mouth every 6 (six) hours as needed., Disp: 105 tablet, Rfl: 0 ?  spironolactone (ALDACTONE) 25 MG tablet, Take 1 tablet (25 mg total) by mouth daily., Disp: 90 tablet, Rfl: 3 ? ?Allergies  ?Allergen Reactions  ? Procaine Hcl   ?  unknown  ? ?Review of Systems ?Objective:  ?There were no vitals filed for this visit. ? ?General: Well developed, nourished, in no acute distress, alert and oriented x3  ? ?Dermatological: Skin is warm, dry and supple bilateral. Nails x 10 are well maintained; remaining integument appears unremarkable at this time. There are no open sores, no preulcerative lesions, no rash or signs of infection present. ? ?Vascular: Dorsalis Pedis artery and Posterior Tibial artery pedal pulses are 2/4 bilateral with immedate capillary fill time. Pedal hair growth present. No varicosities and no lower extremity edema present bilateral.  ? ?Neruologic: Grossly intact via light touch bilateral. Vibratory intact via tuning fork bilateral. Protective threshold with Semmes Wienstein monofilament intact to all pedal sites bilateral. Patellar and  Achilles deep tendon reflexes 2+ bilateral. No Babinski or clonus noted bilateral.  ? ?Musculoskeletal: No gross boney pedal deformities bilateral. No pain, crepitus, or limitation noted with foot and ankle range of motion bilateral. Muscular strength 5/5 in all groups tested bilateral.  Pain on range of motion of the first metatarsophalangeal joint with crepitation.  He has mild medial deviation but not a true varus deformity on physical exam the radiographically varus could be an argument. ? ?Gait: Unassisted, Nonantalgic.  ? ? ?Radiographs: ? ?Radiographs taken today demonstrate what appears to have been a fracture of the proximal phalanx that has gone on to heal.  This is an osseously mature individual with medial displacement of the first metatarsal phalangeal joint and flattening of the articular surface of the distal first metatarsal and the base of the proximal phalanx.  There appears to be hypertrophic sesamoids here as well.  Minimal spurring. ? ?Assessment & Plan:  ? 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 ? ?Plan: Discussed etiology pathology conservative versus surgical therapies.  We did discuss injection therapy fusion, joint replacement or doing nothing.  He would like to consider joint replacement at this point and he will follow-up with me in July for surgical consult after he discusses this with his wife.  Until that time he will continue to utilize his anti-inflammatories and his cane on a regular basis. ? ? ? ? ?Beuna Bolding T. [place]on,  DPM ?

## 2021-08-03 ENCOUNTER — Ambulatory Visit: Payer: 59 | Admitting: Cardiovascular Disease

## 2021-08-21 ENCOUNTER — Other Ambulatory Visit (HOSPITAL_COMMUNITY): Payer: Self-pay

## 2021-08-21 MED ORDER — OXYCODONE HCL 10 MG PO TABS
10.0000 mg | ORAL_TABLET | Freq: Four times a day (QID) | ORAL | 0 refills | Status: DC | PRN
  Filled 2021-08-21: qty 60, 15d supply, fill #0
  Filled 2021-08-21: qty 45, 12d supply, fill #0

## 2021-08-22 ENCOUNTER — Ambulatory Visit: Payer: 59

## 2021-08-22 NOTE — Progress Notes (Incomplete)
Patient ID: Jeffrey Sutton                 DOB: July 05, 1970                      MRN: [id number] ? ? ? ? ?HPI: ?Jeffrey Sutton is a 51 y.o. male referred by Dr. Acie Fredrickson to HTN clinic. PMH is significant for HTN, tobacco abuse. ? ?At his February visit with Dr. Acie Fredrickson, patient was previously taking triamterene-HCTZ 37.5-25 mg daily but had not been taking his medications for 3 months. BP was not at goal in clinic (150/98) secondary to non-adherence; the triamterene-HCTZ was stopped, spironolactone 25 mg daily was initiated, and he was referred to the PharmD for additional HTN medication titration.  ? ??smoking cessation - interested in Chantix? ? ??Dizziness, balance, lightheadedness, headache, blurred vision ??SOB (HF), edema ??Home BP log, home cuff ? ??Taken BP med this AM ? ??Med adherence ? ?Current HTN meds:  ?Spironolactone 25 mg daily ? ?Previously tried:  ?Hydrochlorothiazide 25 mg daily (08/2010 - 04/2013) ?Triamterene-hydrochlorothiazide 37.5-25 mg daily (04/2013 - 06/2021) ? ?BP goal: <130/80 mmHg ? ?Family History: Fhx of CAD, HTN; Cancer in his mother ? ?Social History:  ?07/2018 - smoking 2 PPD ?01/2020 - smoking 1 PPD  ?06/2021 - still smoking, but cut back some ?08/2021 - *** ? ?Diet:  ?01/2020 - 3 monsters daily (6 if on the road), no alcohol ?06/2021 - trying to eat low salt, cut back on energy drinks ?08/2021 - *** ? 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 ?Home BP readings:  ? ?Wt Readings from Last 3 Encounters:  ?07/10/21 217 lb (98.4 kg)  ?01/26/20 222 lb 9.6 oz (101 kg)  ?10/30/18 209 lb (94.8 kg)  ? ?BP Readings from Last 3 Encounters:  ?07/10/21 (!) 150/98  ?01/26/20 134/88  ?10/09/18 121/87  ? ?Pulse Readings from Last 3 Encounters:  ?07/10/21 80  ?01/26/20 74  ?10/09/18 85  ? ? ?Renal function: ?CrCl cannot be calculated (Patient's most recent lab result is older than the maximum 21 days allowed.). ? ?Past Medical History:  ?Diagnosis Date  ? Chest pain   ? Hypertension    ? Myoview   ? Myoview 10/2018:  EF 60, no ischemic or scar; Low Risk  ? ? ?Current Outpatient Medications on File Prior to Visit  ?Medication Sig Dispense Refill  ? naloxone (NARCAN) nasal spray 4 mg/0.1 mL     ? Oxycodone HCl 10 MG TABS Take 1 tablet (10 mg total) by mouth every 6 (six) hours as needed. 105 tablet 0  ? spironolactone (ALDACTONE) 25 MG tablet Take 1 tablet (25 mg total) by mouth daily. 90 tablet 3  ? ?No current facility-administered medications on file prior to visit.  ? ? ?Allergies  ?Allergen Reactions  ? Procaine Hcl   ?  unknown  ? ? ?There were no vitals taken for this visit. ? ? ?Assessment/Plan: ? ?1. Hypertension - BP of *** mm/Hg is ***not at goal after recently starting spironolactone 25 mg daily. ? ? ? ?Thank you ? ?Ramond Dial, Pharm.D, BCPS, CPP ?[id number] [place], [place] [address]  ?Phone: [phone]; Fax: [phone]  ? ? ?

## 2021-09-17 NOTE — Progress Notes (Signed)
Patient ID: Jeffrey Sutton                 DOB: 1970/08/16                      MRN: [id number] ? ? ? ?HPI: ?Jeffrey Sutton is a 51 y.o. male referred by Dr. Acie Fredrickson to HTN clinic. PMH is significant for HTN and chest pain. At last visit with Dr. Acie Fredrickson 07/10/21, BP was 150/98 and he had not been on his BP meds for 3 months. He was changed from triamterene-HCTZ to spironolactone. Follow up BMET 07/31/21 was stable.  ? ?Today, patient arrives in good spirits. Reports he took his spironolactone about 45 minutes ago. He keeps it in his car because he goes out of town often for work so this way he does not forget it. Takes it at whatever time of day he thinks about it. Only missed one dose in the past week. Reports some dizziness but thinks it is due to drinking a lot of caffeine and not staying hydrated. Says it is the same since he started spironolactone as before. Had one headache in the past week when he had been awake for 27 hours and didn't drink as much caffeine as usual. Smokes at least 2 packs/day. Will sometimes go through 4-5 packs per day but does not smoke the whole cigarette. He did drink a Monster this morning. He reports having anxiety about his health but does not feel confident in his ability to quit smoking or caffeine. He is not interested at this time in taking medications to help him quit smoking because he does not want extra nicotine.  ? ?Current HTN meds: spironolactone 25 mg daily ?Previously tried: triamterene-HCTZ 37.5-25 mg daily (tolerated well but did not like how often it made him use the bathroom, was switched to spironolactone after he had been out of it for months), olmesartan-amlodipine-HCTZ 20-5-12.5 mg daily (per patient he never started this) ?BP goal: <130/80 mmHg ? ?Family History: Fhx of CAD, HTN, lung cancer ? ?Social History: Current smoker.  ? ?Diet: Reports being a picky eater. Usually eats meat and potatoes, vegetables. Cutting back on potatoes. [employer] tomato soup -  does not like heart healthy version. Drinks 4-5 Monsters/day. Tries to drink 1 bottle of water for every 2 Monsters.  ? 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  ? ?Home BP readings: Has a wrist cuff and checks occasionally, but when he checks repeats it multiple times. Last reading recalled was 140s/105. Not using correct wrist cuff technique. He has heard the upper arm cuffs are better but since he travels often the wrist cuff is smaller to fit in his bag.  ? ?Labs:  ?-07/31/21: Scr 1.11, K 4.3 (spironolactone 25 mg) ? ?Wt Readings from Last 3 Encounters:  ?07/10/21 217 lb (98.4 kg)  ?01/26/20 222 lb 9.6 oz (101 kg)  ?10/30/18 209 lb (94.8 kg)  ? ?BP Readings from Last 3 Encounters:  ?09/18/21 (!) 168/106  ?07/10/21 (!) 150/98  ?01/26/20 134/88  ? ?Pulse Readings from Last 3 Encounters:  ?09/18/21 93  ?07/10/21 80  ?01/26/20 74  ? ?Renal function: ?CrCl cannot be calculated (Patient's most recent lab result is older than the maximum 21 days allowed.). ? ?Past Medical History:  ?Diagnosis Date  ? Chest pain   ? Hypertension   ? Myoview   ? Myoview 10/2018:  EF 60, no ischemic or scar; Low Risk  ? ?Current Outpatient Medications on File Prior to Visit  ?Medication Sig Dispense Refill  ? naloxone (NARCAN) nasal spray 4 mg/0.1 mL     ? Oxycodone HCl 10 MG TABS Take 1 tablet (10 mg total) by mouth every 6 (six) hours as needed. 105 tablet 0  ? spironolactone (ALDACTONE) 25 MG tablet Take 1 tablet (25 mg total) by mouth daily. 90 tablet 3  ? ?No current facility-administered medications on file prior to visit.  ? ?Allergies  ?Allergen Reactions  ? Procaine Hcl   ?  unknown  ? ? ?Assessment/Plan: ? ?1. Hypertension - BP in clinic today is not at goal <130/80 mmHg. Given elevation in BP today, he will need multiple BP medications to bring to goal. Will start amlodipine 5 mg daily and continue spironolactone 25 mg daily. Extensively  discussed lifestyle modifications including decreasing sodium intake, caffeine intake, and smoking, which would help to lower his BP and cardiac risks. Encouraged him to work on decreasing the number of cigarettes he smokes per day and decrease by 1 Monster per day by next visit which he is agreeable to trying. Will plan to discuss Chantix in more depth at next visit. He is not interested in smoking cessation aids today but encouraged him to think about this in the meantime. Counseled him on proper wrist cuff technique. Instructed him to bring his cuff to next visit for validation and to take all BP medications before his next visit in 1 month. ? ?[name], PharmD ?PGY2 Ambulatory Care Pharmacy Resident ?09/18/2021 10:22 AM ?

## 2021-09-18 ENCOUNTER — Other Ambulatory Visit (HOSPITAL_COMMUNITY): Payer: Self-pay

## 2021-09-18 ENCOUNTER — Ambulatory Visit (INDEPENDENT_AMBULATORY_CARE_PROVIDER_SITE_OTHER): Payer: 59 | Admitting: Student-PharmD

## 2021-09-18 VITALS — BP 168/106 | HR 93

## 2021-09-18 DIAGNOSIS — I10 Essential (primary) hypertension: Secondary | ICD-10-CM | POA: Diagnosis not present

## 2021-09-18 MED ORDER — AMLODIPINE BESYLATE 5 MG PO TABS
5.0000 mg | ORAL_TABLET | Freq: Every day | ORAL | 3 refills | Status: DC
  Filled 2021-09-18: qty 90, 90d supply, fill #0
  Filled 2021-10-22 – 2021-12-20 (×2): qty 90, 90d supply, fill #1
  Filled 2022-01-22: qty 60, 60d supply, fill #2
  Filled 2022-04-17: qty 90, 90d supply, fill #3
  Filled 2022-09-03: qty 30, 30d supply, fill #4

## 2021-09-18 MED ORDER — OXYCODONE HCL 10 MG PO TABS
10.0000 mg | ORAL_TABLET | Freq: Four times a day (QID) | ORAL | 0 refills | Status: DC
  Filled 2021-09-18: qty 105, 27d supply, fill #0

## 2021-09-18 NOTE — Patient Instructions (Signed)
It was nice to see you today! ? ?Your goal blood pressure is less than 130/80 mmHg. In clinic, your blood pressure was 168/106 mmHg. ? ?Medication Changes: ?Begin amlodipine 5 mg daily ? ?Continue spironolactone 25 mg daily ? ?Monitor blood pressure at home daily and keep a log (on your phone or piece of paper) to bring with you to your next visit. Write down date, time, blood pressure and pulse. Bring your wrist cuff to your next appointment. Be sure to take your medicines before that visit as well.  ? ?Keep up the good work with diet and exercise. Aim for a diet full of vegetables, fruit and lean meats (chicken, [place], fish). Try to limit salt intake by eating fresh or frozen vegetables (instead of canned), rinse canned vegetables prior to cooking and do not add any additional salt to meals.  ? ? ?

## 2021-10-16 ENCOUNTER — Ambulatory Visit (INDEPENDENT_AMBULATORY_CARE_PROVIDER_SITE_OTHER): Payer: 59 | Admitting: Pharmacist

## 2021-10-16 ENCOUNTER — Other Ambulatory Visit (HOSPITAL_COMMUNITY): Payer: Self-pay

## 2021-10-16 VITALS — BP 122/82

## 2021-10-16 DIAGNOSIS — I10 Essential (primary) hypertension: Secondary | ICD-10-CM

## 2021-10-16 MED ORDER — OXYCODONE HCL 10 MG PO TABS
10.0000 mg | ORAL_TABLET | Freq: Four times a day (QID) | ORAL | 0 refills | Status: DC | PRN
  Filled 2021-10-22: qty 105, 30d supply, fill #0

## 2021-10-16 NOTE — Patient Instructions (Addendum)
Continue spironolactone 25 mg daily and amlodipine '5mg'$  daily  Continue checking blood pressure Please record these readings and bring with you to your next appointment  Keep working on cutting back smoking and monsters

## 2021-10-16 NOTE — Progress Notes (Signed)
Patient ID: Jeffrey TROUTEN                 DOB: 09/18/1970                      MRN: [id number]    HPI: Jeffrey Sutton is a 51 y.o. male referred by Dr. Acie Fredrickson to HTN clinic. PMH is significant for HTN and chest pain. At last visit with Dr. Acie Fredrickson 07/10/21, BP was 150/98 and he had not been on his BP meds for 3 months. He was changed from triamterene-HCTZ to spironolactone. Follow up BMET 07/31/21 was stable.   At last visit 5/2 in HTN clinic, blood pressure was 168/106. He was started on amlodipine. He was encouraged to decrease the # of cigarettes he smokes per day and to decrease his monster intake by 1.   Patient presents today for follow up. States his home BPs have been 130's/90's. He is placing his arm across his check, but not fully resting it. Rests just a few min. Has cut way back on monsters and smoking, yesterday only had 1 monster and down to 8-7 cigarettes per day for the past 3 weeks now. States he likes the taste of the Monster, does not care about the caffeine. He saw a poster in my office of how much sugar is in it. Does not want to use Chantix or any aid to stop smoking. Has cut way back on his own. Took his blood pressure medications this AM. Last cigarette was several hours ago.  Home wrist cuff: 132/81 Home wrist cuff: 119/81 Clinic cuff:126/84 Clinic cuff: 122/82 Home wrist cuff: 123/78  Current HTN meds: spironolactone 25 mg daily, amlodipine '5mg'$  daily Previously tried: triamterene-HCTZ 37.5-25 mg daily (tolerated well but did not like how often it made him use the bathroom, was switched to spironolactone after he had been out of it for months), olmesartan-amlodipine-HCTZ 20-5-12.5 mg daily (per patient he never started this) BP goal: <130/80 mmHg  Family History: Fhx of CAD, HTN, lung cancer  Social History: Current smoker.   Diet: Reports being a picky eater. Usually eats meat and potatoes, vegetables. Cutting back on potatoes. [employer] tomato soup - does not  like heart healthy version. Drinks 4-5 Monsters/day. Tries to drink 1 bottle of water for every 2 Monsters.   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   Home BP readings: 130's/mid 90's  Labs:  -07/31/21: Scr 1.11, K 4.3 (spironolactone 25 mg)  Wt Readings from Last 3 Encounters:  07/10/21 217 lb (98.4 kg)  01/26/20 222 lb 9.6 oz (101 kg)  10/30/18 209 lb (94.8 kg)   BP Readings from Last 3 Encounters:  09/18/21 (!) 168/106  07/10/21 (!) 150/98  01/26/20 134/88   Pulse Readings from Last 3 Encounters:  09/18/21 93  07/10/21 80  01/26/20 74   Renal function: CrCl cannot be calculated (Patient's most recent lab result is older than the maximum 21 days allowed.).  Past Medical History:  Diagnosis Date   Chest pain    Hypertension    Myoview    Myoview 10/2018:  EF 60, no ischemic or scar; Low Risk   Current Outpatient Medications on File Prior to Visit  Medication Sig Dispense Refill   amLODipine (NORVASC) 5 MG tablet Take 1 tablet (5 mg total) by mouth daily. 90 tablet 3   naloxone (NARCAN) nasal spray 4 mg/0.1  mL      Oxycodone HCl 10 MG TABS Take 1 tablet (10 mg total) by mouth every 6 (six) hours as needed. 105 tablet 0   Oxycodone HCl 10 MG TABS Take 1 tablet (10 mg) by mouth every 6 hours as needed 105 tablet 0   spironolactone (ALDACTONE) 25 MG tablet Take 1 tablet (25 mg total) by mouth daily. 90 tablet 3   No current facility-administered medications on file prior to visit.   Allergies  Allergen Reactions   Procaine Hcl     unknown    Assessment/Plan:  1. Hypertension - BP in clinic today is very close to goal <130/80 mmHg. Home blood pressure cuff found to be accurate. Will continue current medications, spironolactone '25mg'$  daily and amlodipine '5mg'$  daily. He will continue checking BP at home. I have advised him to rest cuff fully across chest and rest 5 min before checking.  Record readings and bring with him to next visit. Patient will continue to working on stopping both monster (energy drinks) and smoking. Follow up in 5 weeks.  Ramond Dial, Pharm.D, BCPS, CPP [facility]  [address], [place], [place] [address]  Phone: [phone]; Fax: [phone]  10/16/2021

## 2021-10-22 ENCOUNTER — Other Ambulatory Visit (HOSPITAL_COMMUNITY): Payer: Self-pay

## 2021-11-16 ENCOUNTER — Other Ambulatory Visit (HOSPITAL_COMMUNITY): Payer: Self-pay

## 2021-11-16 MED ORDER — OXYCODONE HCL 10 MG PO TABS
10.0000 mg | ORAL_TABLET | Freq: Four times a day (QID) | ORAL | 0 refills | Status: DC | PRN
  Filled 2021-11-16 – 2021-11-21 (×2): qty 105, 27d supply, fill #0

## 2021-11-21 ENCOUNTER — Other Ambulatory Visit (HOSPITAL_COMMUNITY): Payer: Self-pay

## 2021-11-21 ENCOUNTER — Ambulatory Visit: Payer: 59

## 2021-11-21 NOTE — Progress Notes (Deleted)
Patient ID: DANNY YACKLEY                 DOB: 10/28/1970                      MRN: [id number]    HPI: Jeffrey Sutton is a 51 y.o. male referred by Dr. Acie Fredrickson to HTN clinic. PMH is significant for HTN and chest pain. At last visit with Dr. Acie Fredrickson 07/10/21, BP was 150/98 and he had not been on his BP meds for 3 months. He was changed from triamterene-HCTZ to spironolactone. Follow up BMET 07/31/21 was stable.   At visit 5/2 in HTN clinic, blood pressure was 168/106. He was started on amlodipine. He was encouraged to decrease the # of cigarettes he smokes per day and to decrease his monster intake by 1. At last visit on 5/30 blood pressure was 122/82. Patients home BP cuff was found to be accurate. No medication changes were made.   Monster? Home readings? Smoking?  Patient presents today for follow up. States his home BPs have been 130's/90's. He is placing his arm across his check, but not fully resting it. Rests just a few min. Has cut way back on monsters and smoking, yesterday only had 1 monster and down to 8-7 cigarettes per day for the past 3 weeks now. States he likes the taste of the Monster, does not care about the caffeine. He saw a poster in my office of how much sugar is in it. Does not want to use Chantix or any aid to stop smoking. Has cut way back on his own. Took his blood pressure medications this AM. Last cigarette was several hours ago.  Home wrist cuff: 132/81 Home wrist cuff: 119/81 Clinic cuff:126/84 Clinic cuff: 122/82 Home wrist cuff: 123/78  Current HTN meds: spironolactone 25 mg daily, amlodipine '5mg'$  daily Previously tried: triamterene-HCTZ 37.5-25 mg daily (tolerated well but did not like how often it made him use the bathroom, was switched to spironolactone after he had been out of it for months), olmesartan-amlodipine-HCTZ 20-5-12.5 mg daily (per patient he never started this) BP goal: <130/80 mmHg  Family History: Fhx of CAD, HTN, lung cancer  Social  History: Current smoker.   Diet: Reports being a picky eater. Usually eats meat and potatoes, vegetables. Cutting back on potatoes. [employer]'s tomato soup - does not like heart healthy version. Drinks 4-5 Monsters/day. Tries to drink 1 bottle of water for every 2 Monsters.   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   Home BP readings: 130's/mid 90's  Labs:  -07/31/21: Scr 1.11, K 4.3 (spironolactone 25 mg)  Wt Readings from Last 3 Encounters:  07/10/21 217 lb (98.4 kg)  01/26/20 222 lb 9.6 oz (101 kg)  10/30/18 209 lb (94.8 kg)   BP Readings from Last 3 Encounters:  10/16/21 122/82  09/18/21 (!) 168/106  07/10/21 (!) 150/98   Pulse Readings from Last 3 Encounters:  09/18/21 93  07/10/21 80  01/26/20 74   Renal function: CrCl cannot be calculated (Patient's most recent lab result is older than the maximum 21 days allowed.).  Past Medical History:  Diagnosis Date   Chest pain    Hypertension    Myoview    Myoview 10/2018:  EF 60, no ischemic or scar; Low Risk   Current Outpatient Medications on File Prior to Visit  Medication Sig Dispense Refill  amLODipine (NORVASC) 5 MG tablet Take 1 tablet (5 mg total) by mouth daily. 90 tablet 3   naloxone (NARCAN) nasal spray 4 mg/0.1 mL      Oxycodone HCl 10 MG TABS Take 1 tablet (10 mg total) by mouth every 6 (six) hours as needed. 105 tablet 0   Oxycodone HCl 10 MG TABS Take 1 tablet (10 mg) by mouth every 6 hours as needed 105 tablet 0   Oxycodone HCl 10 MG TABS Take 1 tablet (10 mg total) by mouth every 6 (six) hours as needed. 105 tablet 0   spironolactone (ALDACTONE) 25 MG tablet Take 1 tablet (25 mg total) by mouth daily. 90 tablet 3   No current facility-administered medications on file prior to visit.   Allergies  Allergen Reactions   Procaine Hcl     unknown    Assessment/Plan:  1. Hypertension - BP in clinic today is very close  to goal <130/80 mmHg. Home blood pressure cuff found to be accurate. Will continue current medications, spironolactone '25mg'$  daily and amlodipine '5mg'$  daily. He will continue checking BP at home. I have advised him to rest cuff fully across chest and rest 5 min before checking. Record readings and bring with him to next visit. Patient will continue to working on stopping both monster (energy drinks) and smoking. Follow up in 5 weeks.  Ramond Dial, Pharm.D, BCPS, CPP [facility]  [address], [place], [place] [address]  Phone: [phone]; Fax: [phone]  11/21/2021

## 2021-12-14 ENCOUNTER — Other Ambulatory Visit (HOSPITAL_COMMUNITY): Payer: Self-pay

## 2021-12-14 MED ORDER — OXYCODONE HCL 10 MG PO TABS
10.0000 mg | ORAL_TABLET | Freq: Four times a day (QID) | ORAL | 0 refills | Status: DC | PRN
  Filled 2021-12-20: qty 105, 27d supply, fill #0

## 2021-12-20 ENCOUNTER — Other Ambulatory Visit (HOSPITAL_COMMUNITY): Payer: Self-pay

## 2022-01-07 DIAGNOSIS — E785 Hyperlipidemia, unspecified: Secondary | ICD-10-CM | POA: Diagnosis not present

## 2022-01-07 DIAGNOSIS — R7303 Prediabetes: Secondary | ICD-10-CM | POA: Diagnosis not present

## 2022-01-07 DIAGNOSIS — I10 Essential (primary) hypertension: Secondary | ICD-10-CM | POA: Diagnosis not present

## 2022-01-08 ENCOUNTER — Other Ambulatory Visit (HOSPITAL_COMMUNITY): Payer: Self-pay

## 2022-01-08 ENCOUNTER — Encounter: Payer: Self-pay | Admitting: Gastroenterology

## 2022-01-08 MED ORDER — MIRTAZAPINE 15 MG PO TABS
15.0000 mg | ORAL_TABLET | Freq: Every day | ORAL | 3 refills | Status: AC
  Filled 2022-01-08: qty 30, 30d supply, fill #0

## 2022-01-08 MED ORDER — ATORVASTATIN CALCIUM 10 MG PO TABS
10.0000 mg | ORAL_TABLET | Freq: Every day | ORAL | 5 refills | Status: DC
  Filled 2022-01-08: qty 30, 30d supply, fill #0

## 2022-01-22 ENCOUNTER — Other Ambulatory Visit (HOSPITAL_COMMUNITY): Payer: Self-pay

## 2022-01-22 MED ORDER — OXYCODONE HCL 10 MG PO TABS
10.0000 mg | ORAL_TABLET | Freq: Four times a day (QID) | ORAL | 0 refills | Status: DC | PRN
  Filled 2022-01-22: qty 35, 9d supply, fill #0
  Filled 2022-01-22: qty 70, 18d supply, fill #0

## 2022-02-15 ENCOUNTER — Telehealth: Payer: Self-pay

## 2022-02-15 NOTE — Telephone Encounter (Signed)
No Show for PV today. Voicemail is not set up on cell (preferred#) or home #'s. Will reattempt call to r/s PV appointment.

## 2022-02-15 NOTE — Telephone Encounter (Signed)
No answer at either number. Unable to leave message.

## 2022-02-15 NOTE — Telephone Encounter (Signed)
Procedure cancellation letter mailed to pt per protocol after failed contact attempts and no call back.

## 2022-02-18 ENCOUNTER — Other Ambulatory Visit (HOSPITAL_COMMUNITY): Payer: Self-pay

## 2022-02-18 MED ORDER — OXYCODONE HCL 10 MG PO TABS
10.0000 mg | ORAL_TABLET | Freq: Four times a day (QID) | ORAL | 0 refills | Status: DC | PRN
  Filled 2022-02-20: qty 105, 27d supply, fill #0

## 2022-02-20 ENCOUNTER — Other Ambulatory Visit (HOSPITAL_COMMUNITY): Payer: Self-pay

## 2022-03-06 ENCOUNTER — Encounter: Payer: 59 | Admitting: Gastroenterology

## 2022-03-19 ENCOUNTER — Other Ambulatory Visit (HOSPITAL_COMMUNITY): Payer: Self-pay

## 2022-03-19 ENCOUNTER — Encounter: Payer: Self-pay | Admitting: Gastroenterology

## 2022-03-19 MED ORDER — OXYCODONE HCL 10 MG PO TABS
10.0000 mg | ORAL_TABLET | Freq: Four times a day (QID) | ORAL | 0 refills | Status: DC | PRN
  Filled 2022-03-19: qty 105, 27d supply, fill #0

## 2022-04-01 ENCOUNTER — Other Ambulatory Visit (HOSPITAL_COMMUNITY): Payer: Self-pay

## 2022-04-01 ENCOUNTER — Ambulatory Visit (AMBULATORY_SURGERY_CENTER): Payer: Self-pay

## 2022-04-01 VITALS — Ht 67.0 in | Wt 213.0 lb

## 2022-04-01 DIAGNOSIS — Z1211 Encounter for screening for malignant neoplasm of colon: Secondary | ICD-10-CM

## 2022-04-01 MED ORDER — NA SULFATE-K SULFATE-MG SULF 17.5-3.13-1.6 GM/177ML PO SOLN
1.0000 | Freq: Once | ORAL | 0 refills | Status: AC
  Filled 2022-04-01: qty 354, 1d supply, fill #0

## 2022-04-01 NOTE — Progress Notes (Signed)
No egg or soy allergy known to patient;  No issues known to pt with past sedation with any surgeries or procedures; Patient denies ever being told they had issues or difficulty with intubation;  No FH of Malignant Hyperthermia; Pt is not on diet pills; Pt is not on home 02;  Pt is not on blood thinners;  Pt denies issues with constipation;  No A fib or A flutter; Have any cardiac testing pending--NO Pt instructed to use Single[employer]com or GoodRx for a price reduction on prep;   Insurance verified during [facility] appt=[facility]  Patient's chart reviewed by Osvaldo Angst CNRA prior to previsit and patient appropriate for the [facility].  Previsit completed and red dot placed by patient's name on their procedure day (on provider's schedule).

## 2022-04-16 ENCOUNTER — Other Ambulatory Visit (HOSPITAL_COMMUNITY): Payer: Self-pay

## 2022-04-16 MED ORDER — OXYCODONE HCL 10 MG PO TABS
10.0000 mg | ORAL_TABLET | Freq: Four times a day (QID) | ORAL | 0 refills | Status: DC | PRN
  Filled 2022-04-17: qty 105, 27d supply, fill #0

## 2022-04-17 ENCOUNTER — Other Ambulatory Visit (HOSPITAL_COMMUNITY): Payer: Self-pay

## 2022-04-29 ENCOUNTER — Encounter: Payer: Self-pay | Admitting: Gastroenterology

## 2022-05-02 ENCOUNTER — Ambulatory Visit (AMBULATORY_SURGERY_CENTER): Payer: 59 | Admitting: Gastroenterology

## 2022-05-02 ENCOUNTER — Encounter: Payer: Self-pay | Admitting: Gastroenterology

## 2022-05-02 VITALS — BP 140/92 | HR 74 | Temp 98.9°F | Resp 15 | Ht 67.0 in | Wt 213.0 lb

## 2022-05-02 DIAGNOSIS — K641 Second degree hemorrhoids: Secondary | ICD-10-CM | POA: Diagnosis not present

## 2022-05-02 DIAGNOSIS — I10 Essential (primary) hypertension: Secondary | ICD-10-CM | POA: Diagnosis not present

## 2022-05-02 DIAGNOSIS — Z1211 Encounter for screening for malignant neoplasm of colon: Secondary | ICD-10-CM

## 2022-05-02 DIAGNOSIS — D120 Benign neoplasm of cecum: Secondary | ICD-10-CM | POA: Diagnosis not present

## 2022-05-02 DIAGNOSIS — D122 Benign neoplasm of ascending colon: Secondary | ICD-10-CM

## 2022-05-02 DIAGNOSIS — E785 Hyperlipidemia, unspecified: Secondary | ICD-10-CM | POA: Diagnosis not present

## 2022-05-02 MED ORDER — SODIUM CHLORIDE 0.9 % IV SOLN: 500.0000 mL | Freq: Once | INTRAVENOUS | Status: DC

## 2022-05-02 NOTE — Progress Notes (Signed)
Called to room to assist during endoscopic procedure.  Patient ID and intended procedure confirmed with present staff. Received instructions for my participation in the procedure from the performing physician.  

## 2022-05-02 NOTE — Progress Notes (Signed)
Sedate, gd SR, tolerated procedure well, VSS, report to RN 

## 2022-05-02 NOTE — Patient Instructions (Signed)
Handout on polyps given.  Resume previous diet.  Continue present medications. Repeat colonoscopy in 3 years for surveillance based on pathology results.  Use fiber, for example Citrucel, Fibercon, Konsyl or Metamucil.      YOU HAD AN ENDOSCOPIC PROCEDURE TODAY AT [facility]:   Refer to the procedure report that was given to you for any specific questions about what was found during the examination.  If the procedure report does not answer your questions, please call your gastroenterologist to clarify.  If you requested that your care partner not be given the details of your procedure findings, then the procedure report has been included in a sealed envelope for you to review at your convenience later.  YOU SHOULD EXPECT: Some feelings of bloating in the abdomen. Passage of more gas than usual.  Walking can help get rid of the air that was put into your GI tract during the procedure and reduce the bloating. If you had a lower endoscopy (such as a colonoscopy or flexible sigmoidoscopy) you may notice spotting of blood in your stool or on the toilet paper. If you underwent a bowel prep for your procedure, you may not have a normal bowel movement for a few days.  Please Note:  You might notice some irritation and congestion in your nose or some drainage.  This is from the oxygen used during your procedure.  There is no need for concern and it should clear up in a day or so.  SYMPTOMS TO REPORT IMMEDIATELY:  Following lower endoscopy (colonoscopy or flexible sigmoidoscopy):  Excessive amounts of blood in the stool  Significant tenderness or worsening of abdominal pains  Swelling of the abdomen that is new, acute  Fever of 100F or higher   For urgent or emergent issues, a gastroenterologist can be reached at any hour by calling [phone]. Do not use MyChart messaging for urgent concerns.    DIET:  We do recommend a small meal at first, but then you may proceed to your  regular diet.  Drink plenty of fluids but you should avoid alcoholic beverages for 24 hours.  ACTIVITY:  You should plan to take it easy for the rest of today and you should NOT DRIVE or use heavy machinery until tomorrow (because of the sedation medicines used during the test).    FOLLOW UP: Our staff will call the number listed on your records the next business day following your procedure.  We will call around 7:15- 8:00 am to check on you and address any questions or concerns that you may have regarding the information given to you following your procedure. If we do not reach you, we will leave a message.     If any biopsies were taken you will be contacted by phone or by letter within the next 1-3 weeks.  Please call us at [phone] if you have not heard about the biopsies in 3 weeks.    SIGNATURES/CONFIDENTIALITY: You and/or your care partner have signed paperwork which will be entered into your electronic medical record.  These signatures attest to the fact that that the information above on your After Visit Summary has been reviewed and is understood.  Full responsibility of the confidentiality of this discharge information lies with you and/or your care-partner.

## 2022-05-02 NOTE — Op Note (Signed)
[facility] Patient Name: Jeffrey Sutton Procedure Date: 05/02/2022 1:54 PM MRN: [id number] Endoscopist: Gerrit Heck , MD, [id number] Age: 51 Referring MD:  Date of Birth: 03/05/1971 Gender: Male Account #: [id number] Procedure:                Colonoscopy Indications:              Screening for colorectal malignant neoplasm, This                            is the patient's first colonoscopy Medicines:                Monitored Anesthesia Care Procedure:                Pre-Anesthesia Assessment:                           - Prior to the procedure, a History and Physical                            was performed, and patient medications and                            allergies were reviewed. The patient's tolerance of                            previous anesthesia was also reviewed. The risks                            and benefits of the procedure and the sedation                            options and risks were discussed with the patient.                            All questions were answered, and informed consent                            was obtained. Prior Anticoagulants: The patient has                            taken no anticoagulant or antiplatelet agents. ASA                            Grade Assessment: II - A patient with mild systemic                            disease. After reviewing the risks and benefits,                            the patient was deemed in satisfactory condition to                            undergo the procedure.  After obtaining informed consent, the colonoscope                            was passed under direct vision. Throughout the                            procedure, the patient's blood pressure, pulse, and                            oxygen saturations were monitored continuously. The                            Colonoscope was introduced through the anus and                            advanced to the the  cecum, identified by the                            appendiceal orifice, ileocecal valve and palpation.                            The colonoscopy was performed without difficulty.                            The patient tolerated the procedure well. The                            quality of the bowel preparation was good. The                            ileocecal valve, appendiceal orifice, and rectum                            were photographed. Scope In: 2:09:57 PM Scope Out: 2:29:08 PM Scope Withdrawal Time: 0 hours 16 minutes 15 seconds  Total Procedure Duration: 0 hours 19 minutes 11 seconds  Findings:                 Hemorrhoids were found on perianal exam.                           Two sessile polyps were found in the ascending                            colon and cecum. The polyps were 8 to 12 mm in                            size. These polyps were removed with a cold snare.                            Resection and retrieval were complete. Estimated                            blood loss was minimal.  The exam was otherwise normal throughout the                            remainder of the colon.                           Non-bleeding internal hemorrhoids were found during                            retroflexion. The hemorrhoids were small and Grade                            II (internal hemorrhoids that prolapse but reduce                            spontaneously). Complications:            No immediate complications. Estimated Blood Loss:     Estimated blood loss was minimal. Impression:               - Hemorrhoids found on perianal exam.                           - Two 8 to 12 mm polyps in the ascending colon and                            in the cecum, removed with a cold snare. Resected                            and retrieved.                           - Non-bleeding internal hemorrhoids. Recommendation:           - Patient has a contact number  available for                            emergencies. The signs and symptoms of potential                            delayed complications were discussed with the                            patient. Return to normal activities tomorrow.                            Written discharge instructions were provided to the                            patient.                           - Resume previous diet.                           - Continue present medications.                           -  Await pathology results.                           - Repeat colonoscopy in 3 years for surveillance                            based on pathology results.                           - Return to GI office PRN.                           - Use fiber, for example Citrucel, Fibercon, Konsyl                            or Metamucil.                           - Internal hemorrhoids were noted on this study and                            may be amenable to hemorrhoid band ligation. If you                            are interested in further treatment of these                            hemorrhoids with band ligation, please contact my                            clinic to set up an appointment for evaluation and                            treatment. Gerrit Heck, MD 05/02/2022 2:34:34 PM

## 2022-05-02 NOTE — Progress Notes (Signed)
Pt's states no medical or surgical changes since previsit or office visit. 

## 2022-05-02 NOTE — Progress Notes (Signed)
GASTROENTEROLOGY PROCEDURE H&P NOTE   Primary Care Physician: Harvie Junior, MD    Reason for Procedure:  Colon Cancer screening  Plan:    Colonoscopy  Patient is appropriate for endoscopic procedure(s) in the ambulatory ([facility]) setting.  The nature of the procedure, as well as the risks, benefits, and alternatives were carefully and thoroughly reviewed with the patient. Ample time for discussion and questions allowed. The patient understood, was satisfied, and agreed to proceed.     HPI: Jeffrey Sutton is a 51 y.o. male who presents for colonoscopy for routine Colon Cancer screening.  No active GI symptoms.  No known family history of colon cancer or related malignancy.  Patient is otherwise without complaints or active issues today.  Past Medical History:  Diagnosis Date   Arthritis    RIGHT foot/LEFT shoulder   Asthma    childhood   Chest pain    GERD (gastroesophageal reflux disease)    OTC PRN meds- with certain foods   Heart murmur    childhood   Hyperlipidemia    on meds   Hypertension    on meds   Myoview    Myoview 10/2018:  EF 60, no ischemic or scar; Low Risk    Past Surgical History:  Procedure Laterality Date   FOOT FRACTURE SURGERY Right 2008   TARSAL METATARSAL FUSION WITH WEIL OSTEOTOMY Right 2017   great toe after 2008 fracture sx   TONSILLECTOMY AND ADENOIDECTOMY  [facility]    Prior to Admission medications   Medication Sig Start Date End Date Taking? Authorizing Provider  amLODipine (NORVASC) 5 MG tablet Take 1 tablet (5 mg total) by mouth daily. 09/18/21  Yes Nahser, Wonda Cheng, MD  aspirin-acetaminophen-caffeine (EXCEDRIN MIGRAINE) [phone] MG tablet Take 1 tablet by mouth every 6 (six) hours as needed for headache.   Yes [provider]  omeprazole (PRILOSEC) 10 MG capsule Take 10 mg by mouth daily as needed (as needed).   Yes [provider]  Oxycodone HCl 10 MG TABS Take 1 tablet (10 mg  total) by mouth every 6 (six) hours as needed. 04/17/22  Yes   spironolactone (ALDACTONE) 25 MG tablet Take 1 tablet (25 mg total) by mouth daily. 07/10/21  Yes Nahser, Wonda Cheng, MD  atorvastatin (LIPITOR) 10 MG tablet Take 1 tablet (10 mg total) by mouth daily. 01/08/22     mirtazapine (REMERON) 15 MG tablet Take 1 tablet (15 mg total) by mouth at bedtime. Patient not taking: Reported on 05/02/2022 01/08/22     naloxone [facility]) nasal spray 4 mg/0.1 mL  06/22/21   [provider]  Oxycodone HCl 10 MG TABS Take 1 tablet (10 mg total) by mouth every 6 (six) hours as needed. Patient not taking: Reported on 05/02/2022 03/19/22       Current Outpatient Medications  Medication Sig Dispense Refill   amLODipine (NORVASC) 5 MG tablet Take 1 tablet (5 mg total) by mouth daily. 90 tablet 3   aspirin-acetaminophen-caffeine (EXCEDRIN MIGRAINE) 250-250-65 MG tablet Take 1 tablet by mouth every 6 (six) hours as needed for headache.     omeprazole (PRILOSEC) 10 MG capsule Take 10 mg by mouth daily as needed (as needed).     Oxycodone HCl 10 MG TABS Take 1 tablet (10 mg total) by mouth every 6 (six) hours as needed. 105 tablet 0   spironolactone (ALDACTONE) 25 MG tablet Take 1 tablet (25 mg total) by mouth daily. 90 tablet  3   atorvastatin (LIPITOR) 10 MG tablet Take 1 tablet (10 mg total) by mouth daily. 30 tablet 5   mirtazapine (REMERON) 15 MG tablet Take 1 tablet (15 mg total) by mouth at bedtime. (Patient not taking: Reported on 05/02/2022) 30 tablet 3   naloxone (NARCAN) nasal spray 4 mg/0.1 mL      Oxycodone HCl 10 MG TABS Take 1 tablet (10 mg total) by mouth every 6 (six) hours as needed. (Patient not taking: Reported on 05/02/2022) 105 tablet 0   No current facility-administered medications for this visit.    Allergies as of 05/02/2022 - Review Complete 05/02/2022  Allergen Reaction Noted   Procaine hcl  08/31/2010    Family History  Problem Relation Age of Onset   Lung cancer Mother 109    Cirrhosis Father    Colon polyps Brother 29   Colon cancer Neg Hx    Esophageal cancer Neg Hx    Stomach cancer Neg Hx    Rectal cancer Neg Hx     Social History   Socioeconomic History   Marital status: Married    Spouse name: Not on file   Number of children: Not on file   Years of education: Not on file   Highest education level: Not on file  Occupational History   Not on file  Tobacco Use   Smoking status: Every Day    Packs/day: 1.00    Types: Cigarettes   Smokeless tobacco: Never   Tobacco comments:    Has decreased amount of cigarettes since mother passed from lung CA  Vaping Use   Vaping Use: Never used  Substance and Sexual Activity   Alcohol use: No   Drug use: No   Sexual activity: Not on file  Other Topics Concern   Not on file  Social History Narrative   Not on file   Social Determinants of Health   Financial Resource Strain: Not on file  Food Insecurity: Not on file  Transportation Needs: Not on file  Physical Activity: Not on file  Stress: Not on file  Social Connections: Not on file  Intimate Partner Violence: Not on file    Physical Exam: Vital signs in last 24 hours: '@BP'$  134/83   Pulse 95   Temp 98.9 F (37.2 C)   Ht '5\' 7"'$  (1.702 m)   Wt 213 lb (96.6 kg)   SpO2 98%   BMI 33.36 kg/m  GEN: NAD EYE: Sclerae anicteric ENT: MMM CV: Non-tachycardic Pulm: CTA b/l GI: Soft, NT/ND NEURO:  Alert & Oriented x 3   Gerrit Heck, DO [facility]   05/02/2022 2:01 PM

## 2022-05-03 ENCOUNTER — Telehealth: Payer: Self-pay

## 2022-05-03 NOTE — Telephone Encounter (Signed)
  Follow up Call-     05/02/2022    1:26 PM  Call back number  Post procedure Call Back phone  # [phone]  Permission to leave phone message Yes     Patient questions:  Do you have a fever, pain , or abdominal swelling? No. Pain Score  0 *  Have you tolerated food without any problems? Yes.    Have you been able to return to your normal activities? Yes.    Do you have any questions about your discharge instructions: Diet   No. Medications  No. Follow up visit  No.  Do you have questions or concerns about your Care? No.  Actions: * If pain score is 4 or above: No action needed, pain <4.

## 2022-05-09 ENCOUNTER — Encounter: Payer: Self-pay | Admitting: Gastroenterology

## 2022-05-17 ENCOUNTER — Other Ambulatory Visit (HOSPITAL_COMMUNITY): Payer: Self-pay

## 2022-05-17 MED ORDER — OXYCODONE HCL 10 MG PO TABS
10.0000 mg | ORAL_TABLET | Freq: Four times a day (QID) | ORAL | 0 refills | Status: DC | PRN
  Filled 2022-05-17: qty 105, 27d supply, fill #0

## 2022-06-18 ENCOUNTER — Other Ambulatory Visit (HOSPITAL_COMMUNITY): Payer: Self-pay

## 2022-06-18 MED ORDER — NALOXONE HCL 4 MG/0.1ML NA LIQD
1.0000 | NASAL | 0 refills | Status: AC
  Filled 2022-06-18: qty 2, 30d supply, fill #0

## 2022-06-18 MED ORDER — OXYCODONE HCL 10 MG PO TABS
10.0000 mg | ORAL_TABLET | Freq: Four times a day (QID) | ORAL | 0 refills | Status: AC | PRN
  Filled 2022-06-18 (×2): qty 105, 27d supply, fill #0

## 2022-07-16 ENCOUNTER — Other Ambulatory Visit (HOSPITAL_COMMUNITY): Payer: Self-pay

## 2022-07-16 ENCOUNTER — Encounter: Payer: Self-pay | Admitting: Cardiovascular Disease

## 2022-07-16 ENCOUNTER — Ambulatory Visit: Payer: 59 | Attending: Cardiovascular Disease | Admitting: Cardiovascular Disease

## 2022-07-16 VITALS — BP 148/90 | HR 72 | Ht 67.0 in | Wt 221.8 lb

## 2022-07-16 DIAGNOSIS — Z8249 Family history of ischemic heart disease and other diseases of the circulatory system: Secondary | ICD-10-CM | POA: Diagnosis not present

## 2022-07-16 DIAGNOSIS — Z79899 Other long term (current) drug therapy: Secondary | ICD-10-CM

## 2022-07-16 DIAGNOSIS — I10 Essential (primary) hypertension: Secondary | ICD-10-CM | POA: Diagnosis not present

## 2022-07-16 MED ORDER — OXYCODONE HCL 10 MG PO TABS
10.0000 mg | ORAL_TABLET | Freq: Four times a day (QID) | ORAL | 0 refills | Status: DC | PRN
  Filled 2022-07-19: qty 105, 27d supply, fill #0

## 2022-07-16 MED ORDER — LOSARTAN POTASSIUM 50 MG PO TABS
50.0000 mg | ORAL_TABLET | Freq: Every day | ORAL | 3 refills | Status: AC
  Filled 2022-07-16: qty 90, 90d supply, fill #0
  Filled 2022-10-15: qty 90, 90d supply, fill #1

## 2022-07-16 NOTE — Patient Instructions (Signed)
Medication Instructions:  START Losartan '50mg'$  daily (Cozaar) *If you need a refill on your cardiac medications before your next appointment, please call your pharmacy*   Lab Work: Lipids, ALT, BMET, Lipoprotein(a) today BMET in 3 weeks If you have labs (blood work) drawn today and your tests are completely normal, you will receive your results only by: [place] (if you have MyChart) OR A paper copy in the mail If you have any lab test that is abnormal or we need to change your treatment, we will call you to review the results.   Testing/Procedures: NONE   Follow-Up: At [facility], you and your health needs are our priority.  As part of our continuing mission to provide you with exceptional heart care, we have created designated Provider Care Teams.  These Care Teams include your primary Cardiologist (physician) and Advanced Practice Providers (APPs -  Physician Assistants and Nurse Practitioners) who all work together to provide you with the care you need, when you need it.  We recommend signing up for the patient portal called "MyChart".  Sign up information is provided on this After Visit Summary.  MyChart is used to connect with patients for Virtual Visits (Telemedicine).  Patients are able to view lab/test results, encounter notes, upcoming appointments, etc.  Non-urgent messages can be sent to your provider as well.   To learn more about what you can do with MyChart, go to [email or web address].    Your next appointment:   3 month(s)  Provider:   Mertie Moores, MD

## 2022-07-16 NOTE — Progress Notes (Signed)
Jeffrey Sutton Date of Birth  12-11-1970       [place]      [address] [place]    [place], [place]  [address]     [phone]         Fax  [phone]       Problem List: 1. Hypertension  Previous notes   November 18, 2012:  Jeffrey Sutton presents today- he had a severe episode of pleurisy about 1 month ago. The pain was severe and only occurred with a deep breath.  He has a chronic cough.  No hemoptysis. No fever.  The pain lasted for 1 hour followed by some very mild pains.    He has felt well.  He has not been sleeping well - perhaps 4 hours a night.   He is active and exercise regularly.    January 14, 2014:  [facility]rey Sutton is doing well.  He is now doing tatoo work full time.  Marland Kitchen  He occasionally has some atypical CP .  Just lasted 1-2 seconds.   Exercising some   Nov. 17, 2016:  Traveling quite a bit.  Exercising some   July 18, 2017:  Last fall, had an anxiety attack,  Had some stabbing chest pain .  Has occurred a second time  Exercised regularly  Has lost 30 lbs.   Sept. 8., 2021 [facility] is seen today for follow up visit. Has a hx of pleuretic CP in the past.   Has a hx of HTN Still having this pleuretic CP  Worse with sitting down,  Worse with deep breath .  Not associated with exercise  Still smoking 1 ppd or less .  Averages 3 Monster drinks a day On the road, probably 6 monster drinks Does not drink alcohol Has gained weight .   Has not had covid vaccine.  Was in [place] in Nov. 2018.   Had a 2 week illness following that.  thnks he has already had it .   Feb. 21, 2023 [facility] [employer] is seen today for follow up of his pleuretic CP  Hx of HTN Was last seen 2 years ago  BP is elevated.  Has not been on his BP meds for 3 months  Has been trying to eat low salt Still smoking some - has cut back  Has cut back on his energy drinks  No further CP  No COVID vaccines,  has not had covid to his knowledge   July 16, 2022: [facility] is seen today for  follow-up of his hypertension.  Still eats a very poor diet .  Drinks energy drinks,  still smoking   Has been wanting to quit smoking but has not been able to  Has tried nicotine patches ( he did not apply them correctly, wore several at a time ) The mornings are a weak time - wakes up and smokes right away  Works at a [employer] is busy at work but no cardio exercise   We discussed nicotine replacement      Current Outpatient Medications on File Prior to Visit  Medication Sig Dispense Refill   amLODipine (NORVASC) 5 MG tablet Take 1 tablet (5 mg total) by mouth daily. 90 tablet 3   aspirin-acetaminophen-caffeine (EXCEDRIN MIGRAINE) 250-250-65 MG tablet Take 1 tablet by mouth every 6 (six) hours as needed for headache.     omeprazole (PRILOSEC) 10 MG capsule Take 10 mg by mouth daily as needed (as needed).  Oxycodone HCl 10 MG TABS Take 1 tablet (10 mg total) by mouth every 6 (six) hours as needed. 105 tablet 0   spironolactone (ALDACTONE) 25 MG tablet Take 1 tablet (25 mg total) by mouth daily. 90 tablet 3   atorvastatin (LIPITOR) 10 MG tablet Take 1 tablet (10 mg total) by mouth daily. (Patient not taking: Reported on 07/16/2022) 30 tablet 5   mirtazapine (REMERON) 15 MG tablet Take 1 tablet (15 mg total) by mouth at bedtime. (Patient not taking: Reported on 05/02/2022) 30 tablet 3   naloxone (NARCAN) nasal spray 4 mg/0.1 mL Inhale 1 spray (4 mg) into nostril one time (Patient not taking: Reported on 07/16/2022) 2 each 0   No current facility-administered medications on file prior to visit.    Allergies  Allergen Reactions   Procaine Hcl     unknown    Past Medical History:  Diagnosis Date   Arthritis    RIGHT foot/LEFT shoulder   Asthma    childhood   Chest pain    GERD (gastroesophageal reflux disease)    OTC PRN meds- with certain foods   Heart murmur    childhood   Hyperlipidemia    on meds   Hypertension    on meds   Myoview    Myoview 10/2018:  EF 60,  no ischemic or scar; Low Risk    Past Surgical History:  Procedure Laterality Date   FOOT FRACTURE SURGERY Right 2008   TARSAL METATARSAL FUSION WITH WEIL OSTEOTOMY Right 2017   great toe after 2008 fracture sx   TONSILLECTOMY AND ADENOIDECTOMY  1977   WISDOM TOOTH EXTRACTION  1989    Social History   Tobacco Use  Smoking Status Every Day   Packs/day: 1.00   Types: Cigarettes  Smokeless Tobacco Never  Tobacco Comments   Has decreased amount of cigarettes since mother passed from lung CA    Social History   Substance and Sexual Activity  Alcohol Use No    Family History  Problem Relation Age of Onset   Lung cancer Mother 55   Cirrhosis Father    Colon polyps Brother 18   Colon cancer Neg Hx    Esophageal cancer Neg Hx    Stomach cancer Neg Hx    Rectal cancer Neg Hx     Reviw of Systems:  Reviewed in the HPI.  All other systems are negative.  Physical Exam: Blood pressure (!) 148/90, pulse 72, height '5\' 7"'$  (1.702 m), weight 221 lb 12.8 oz (100.6 kg), SpO2 98 %.  HYPERTENSION CONTROL Vitals:   07/16/22 1607 07/16/22 1638  BP: (!) 154/92 (!) 148/90    The patient's blood pressure is elevated above target today.  In order to address the patient's elevated BP: A new medication was prescribed today.       GEN:  Well nourished, well developed in no acute distress HEENT: Normal NECK: No JVD; No carotid bruits LYMPHATICS: No lymphadenopathy CARDIAC: RRR , no murmurs, rbs, gallops RESPIRATORY:  Clear to auscultation without rales, wheezing or rhonchi  ABDOMEN: Soft, non-tender, non-distended MUSCULOSKELETAL:  No edema; No deformity  SKIN: Warm and dry NEUROLOGIC:  Alert and oriented x 3    ECG:   Feb. 27, 2024:   NSR at 72.  Normal ECG      Assessment / Plan:   1. Essential HTN:     BP remains elevated.  He admits to not eating exactly like he should.  He continues to smoke.  Will  have him work on his smoking.  Will start him on losartan 50 mg a  day.  Will check a basic metabolic profile in 3 weeks.   2. Smoking:    I recommend that he try using the nicotine nicotine replacement products.  He may benefit from a nicotine inhaler.  3.  Atypical CP .   4.  Family hx of CAD . Will check lipids, LP(a) BMP ,   I will see him again in 3 months for follow-up visit.Marland Kitchen      Mertie Moores, MD  07/16/2022 5:59 PM    [place],  [place], [place]  [address] Pager [phone] Phone: [phone]; Fax: [phone]

## 2022-07-17 ENCOUNTER — Other Ambulatory Visit (HOSPITAL_COMMUNITY): Payer: Self-pay

## 2022-07-17 LAB — BASIC METABOLIC PANEL
BUN/Creatinine Ratio: 12 (ref 9–20)
BUN: 12 mg/dL (ref 6–24)
CO2: 23 mmol/L (ref 20–29)
Calcium: 9.5 mg/dL (ref 8.7–10.2)
Chloride: 103 mmol/L (ref 96–106)
Creatinine, Ser: 1.04 mg/dL (ref 0.76–1.27)
Glucose: 91 mg/dL (ref 70–99)
Potassium: 4.6 mmol/L (ref 3.5–5.2)
Sodium: 140 mmol/L (ref 134–144)
eGFR: 87 mL/min/{1.73_m2} (ref >59)

## 2022-07-17 LAB — LIPID PANEL
Chol/HDL Ratio: 5.2 ratio — ABNORMAL HIGH (ref 0.0–5.0)
Cholesterol, Total: 197 mg/dL (ref 100–199)
HDL: 38 mg/dL — ABNORMAL LOW (ref >39)
LDL Chol Calc (NIH): 128 mg/dL — ABNORMAL HIGH (ref 0–99)
Triglycerides: 174 mg/dL — ABNORMAL HIGH (ref 0–149)
VLDL Cholesterol Cal: 31 mg/dL (ref 5–40)

## 2022-07-17 LAB — ALT: ALT: 30 IU/L (ref 0–44)

## 2022-07-17 LAB — LIPOPROTEIN A (LPA): Lipoprotein (a): 101.6 nmol/L — ABNORMAL HIGH (ref <75.0)

## 2022-07-18 ENCOUNTER — Other Ambulatory Visit (HOSPITAL_COMMUNITY): Payer: Self-pay

## 2022-07-18 ENCOUNTER — Telehealth: Payer: Self-pay | Admitting: Cardiovascular Disease

## 2022-07-18 MED ORDER — ATORVASTATIN CALCIUM 40 MG PO TABS
40.0000 mg | ORAL_TABLET | Freq: Every day | ORAL | 3 refills | Status: AC
  Filled 2022-07-18: qty 90, 90d supply, fill #0
  Filled 2022-10-15: qty 90, 90d supply, fill #1

## 2022-07-18 NOTE — Telephone Encounter (Signed)
-----   Message from Thayer Headings, MD sent at 07/18/2022  8:53 AM EST ----- LP(a) is elevated LDL is 128 He has been on atorva 10 mg  in the past   Lets restart Atorva 40 mg  He he does not tolerate it, I would have a low threshold to refer him to the lipid clinic for consideration for Inclisiran or PCSK9 inhibitors

## 2022-07-18 NOTE — Telephone Encounter (Signed)
Called and spoke with patient who agrees to begin Atorvastatin '40mg'$  daily. We will check his labs when he comes in May

## 2022-07-19 ENCOUNTER — Other Ambulatory Visit (HOSPITAL_COMMUNITY): Payer: Self-pay

## 2022-08-06 ENCOUNTER — Ambulatory Visit: Payer: 59 | Attending: Cardiovascular Disease

## 2022-08-06 DIAGNOSIS — Z8249 Family history of ischemic heart disease and other diseases of the circulatory system: Secondary | ICD-10-CM | POA: Diagnosis not present

## 2022-08-06 DIAGNOSIS — I10 Essential (primary) hypertension: Secondary | ICD-10-CM

## 2022-08-06 DIAGNOSIS — Z79899 Other long term (current) drug therapy: Secondary | ICD-10-CM

## 2022-08-07 LAB — BASIC METABOLIC PANEL
BUN/Creatinine Ratio: 15 (ref 9–20)
BUN: 13 mg/dL (ref 6–24)
CO2: 24 mmol/L (ref 20–29)
Calcium: 9.9 mg/dL (ref 8.7–10.2)
Chloride: 102 mmol/L (ref 96–106)
Creatinine, Ser: 0.89 mg/dL (ref 0.76–1.27)
Glucose: 96 mg/dL (ref 70–99)
Potassium: 4.3 mmol/L (ref 3.5–5.2)
Sodium: 142 mmol/L (ref 134–144)
eGFR: 104 mL/min/{1.73_m2} (ref >59)

## 2022-08-09 DIAGNOSIS — D1801 Hemangioma of skin and subcutaneous tissue: Secondary | ICD-10-CM | POA: Diagnosis not present

## 2022-08-09 DIAGNOSIS — L570 Actinic keratosis: Secondary | ICD-10-CM | POA: Diagnosis not present

## 2022-08-13 ENCOUNTER — Other Ambulatory Visit (HOSPITAL_COMMUNITY): Payer: Self-pay

## 2022-08-13 MED ORDER — OXYCODONE HCL 10 MG PO TABS
10.0000 mg | ORAL_TABLET | Freq: Four times a day (QID) | ORAL | 0 refills | Status: AC | PRN
  Filled 2022-08-19: qty 105, 27d supply, fill #0

## 2022-08-19 ENCOUNTER — Other Ambulatory Visit (HOSPITAL_COMMUNITY): Payer: Self-pay

## 2022-09-03 ENCOUNTER — Other Ambulatory Visit (HOSPITAL_COMMUNITY): Payer: Self-pay

## 2022-09-03 ENCOUNTER — Other Ambulatory Visit: Payer: Self-pay

## 2022-09-10 ENCOUNTER — Other Ambulatory Visit (HOSPITAL_COMMUNITY): Payer: Self-pay

## 2022-09-10 MED ORDER — OXYCODONE HCL 10 MG PO TABS
10.0000 mg | ORAL_TABLET | Freq: Four times a day (QID) | ORAL | 0 refills | Status: AC | PRN
  Filled 2022-09-18: qty 105, 27d supply, fill #0

## 2022-09-12 ENCOUNTER — Other Ambulatory Visit (HOSPITAL_COMMUNITY): Payer: Self-pay

## 2022-09-17 ENCOUNTER — Other Ambulatory Visit: Payer: Self-pay | Admitting: Cardiovascular Disease

## 2022-09-17 ENCOUNTER — Other Ambulatory Visit (HOSPITAL_COMMUNITY): Payer: Self-pay

## 2022-09-17 MED ORDER — SPIRONOLACTONE 25 MG PO TABS
25.0000 mg | ORAL_TABLET | Freq: Every day | ORAL | 3 refills | Status: AC
  Filled 2022-09-17: qty 90, 90d supply, fill #0

## 2022-09-18 ENCOUNTER — Other Ambulatory Visit (HOSPITAL_COMMUNITY): Payer: Self-pay

## 2022-10-14 NOTE — Progress Notes (Unsigned)
Jeffrey Sutton Date of Birth  [birth year]       [facility]      [address]    [place], [place]  [address]     [phone]         Fax  [phone]       Problem List: 1. Hypertension  Previous notes   November 18, 2012:  Jeffrey Sutton presents today- he had a severe episode of pleurisy about 1 month ago. The pain was severe and only occurred with a deep breath.  He has a chronic cough.  No hemoptysis. No fever.  The pain lasted for 1 hour followed by some very mild pains.    He has felt well.  He has not been sleeping well - perhaps 4 hours a night.   He is active and exercise regularly.    January 14, 2014:  Jeffrey Sutton is doing well.  He is now doing tatoo work full time.  Marland Kitchen  He occasionally has some atypical CP .  Just lasted 1-2 seconds.   Exercising some   Nov. 17, 2016:  Traveling quite a bit.  Exercising some   July 18, 2017:  Last fall, had an anxiety attack,  Had some stabbing chest pain .  Has occurred a second time  Exercised regularly  Has lost 30 lbs.   Sept. 8., 2021 Jeffrey Sutton is seen today for follow up visit. Has a hx of pleuretic CP in the past.   Has a hx of HTN Still having this pleuretic CP  Worse with sitting down,  Worse with deep breath .  Not associated with exercise  Still smoking 1 ppd or less .  Averages 3 Monster drinks a day On the road, probably 6 monster drinks Does not drink alcohol Has gained weight .   Has not had covid vaccine.  Was in [place] in Nov. 2018.   Had a 2 week illness following that.  thnks he has already had it .   Feb. 21, 2023 Jeffrey Sutton is seen today for follow up of his pleuretic CP  Hx of HTN Was last seen 2 years ago  BP is elevated.  Has not been on his BP meds for 3 months  Has been trying to eat low salt Still smoking some - has cut back  Has cut back on his energy drinks  No further CP  No COVID vaccines,  has not had covid to his knowledge   July 16, 2022: Jeffrey Sutton  is seen today  for follow-up of his hypertension.  Still eats a very poor diet .  Drinks energy drinks,  still smoking   Has been wanting to quit smoking but has not been able to  Has tried nicotine patches ( he did not apply them correctly, wore several at a time ) The mornings are a weak time - wakes up and smokes right away  Works at [employer]  So is busy at work but no cardio exercise   We discussed nicotine replacement   Oct 16, 2022 Jeffrey Sutton is seen for follow up of his HTN, HLD  Stopped smoking 3-4 days ago  Is also reducing his monster drinks      Current Outpatient Medications on File Prior to Visit  Medication Sig Dispense Refill   amLODipine (NORVASC) 5 MG tablet Take 1 tablet (5 mg total) by mouth daily. 90 tablet 3   aspirin-acetaminophen-caffeine (EXCEDRIN MIGRAINE) 250-250-65 MG tablet Take 1 tablet by mouth every  6 (six) hours as needed for headache.     atorvastatin (LIPITOR) 40 MG tablet Take 1 tablet (40 mg total) by mouth daily. 90 tablet 3   losartan (COZAAR) 50 MG tablet Take 1 tablet (50 mg total) by mouth daily. 90 tablet 3   mirtazapine (REMERON) 15 MG tablet Take 1 tablet (15 mg total) by mouth at bedtime. 30 tablet 3   omeprazole (PRILOSEC) 10 MG capsule Take 10 mg by mouth daily as needed (as needed).     Oxycodone HCl 10 MG TABS Take 1 tablet (10 mg total) by mouth every 6 (six) hours as needed. 105 tablet 0   spironolactone (ALDACTONE) 25 MG tablet Take 1 tablet (25 mg total) by mouth daily. 90 tablet 3   doxycycline (VIBRAMYCIN) 100 MG capsule Take 1 capsule (100 mg total) by mouth every 12 (twelve) hours for 10 days (Patient not taking: Reported on 10/16/2022) 20 capsule 0   naloxone (NARCAN) nasal spray 4 mg/0.1 mL Inhale 1 spray (4 mg) into nostril one time (Patient not taking: Reported on 07/16/2022) 2 each 0   Oxycodone HCl 10 MG TABS Take 1 tablet (10 mg total) by mouth every 6 (six) hours as needed. (Patient not taking: Reported on 10/16/2022) 105 tablet 0    Oxycodone HCl 10 MG TABS Take 1 tablet (10 mg total) by mouth every 6 (six) hours as needed. (Patient not taking: Reported on 10/16/2022) 105 tablet 0   Oxycodone HCl 10 MG TABS Take 1 tablet (10 mg total) by mouth every 6 (six) hours as needed. (Patient not taking: Reported on 10/16/2022) 105 tablet 0   No current facility-administered medications on file prior to visit.    Allergies  Allergen Reactions   Procaine Hcl     unknown    Past Medical History:  Diagnosis Date   Arthritis    RIGHT foot/LEFT shoulder   Asthma    childhood   Chest pain    GERD (gastroesophageal reflux disease)    OTC PRN meds- with certain foods   Heart murmur    childhood   Hyperlipidemia    on meds   Hypertension    on meds   Myoview    Myoview 10/2018:  EF 60, no ischemic or scar; Low Risk    Past Surgical History:  Procedure Laterality Date   FOOT FRACTURE SURGERY Right 2008   TARSAL METATARSAL FUSION WITH WEIL OSTEOTOMY Right 2017   great toe after 2008 fracture sx   TONSILLECTOMY AND ADENOIDECTOMY  1977   WISDOM TOOTH EXTRACTION  1989    Social History   Tobacco Use  Smoking Status Every Day   Packs/day: 1   Types: Cigarettes  Smokeless Tobacco Never  Tobacco Comments   Has decreased amount of cigarettes since mother passed from lung CA    Social History   Substance and Sexual Activity  Alcohol Use No    Family History  Problem Relation Age of Onset   Lung cancer Mother 53   Cirrhosis Father    Colon polyps Brother 58   Colon cancer Neg Hx    Esophageal cancer Neg Hx    Stomach cancer Neg Hx    Rectal cancer Neg Hx     Reviw of Systems:  Reviewed in the HPI.  All other systems are negative.      ECG:        Assessment / Plan:   1. Essential HTN:      BP is well controlled.  I'm very pleased with his BP control .      2. Smoking:    stopped smoking 3-4 days ago.  Encouraged him to stay off cigarettes.   3.  Atypical CP .   No further episodes of CP    4.  Family hx of CAD .           Kristeen Miss, MD  10/16/2022 10:00 AM    [facility] [address] [place],  [address] [place], [place]  [address] Pager [phone] Phone: [phone]; Fax: [phone]

## 2022-10-15 ENCOUNTER — Other Ambulatory Visit (HOSPITAL_COMMUNITY): Payer: Self-pay

## 2022-10-15 ENCOUNTER — Other Ambulatory Visit: Payer: Self-pay

## 2022-10-15 MED ORDER — DOXYCYCLINE HYCLATE 100 MG PO CAPS
100.0000 mg | ORAL_CAPSULE | Freq: Two times a day (BID) | ORAL | 0 refills | Status: AC
  Filled 2022-10-15: qty 20, 10d supply, fill #0

## 2022-10-15 MED ORDER — OXYCODONE HCL 10 MG PO TABS
10.0000 mg | ORAL_TABLET | Freq: Four times a day (QID) | ORAL | 0 refills | Status: DC | PRN
  Filled 2022-10-17: qty 105, 27d supply, fill #0

## 2022-10-16 ENCOUNTER — Encounter: Payer: Self-pay | Admitting: Cardiovascular Disease

## 2022-10-16 ENCOUNTER — Other Ambulatory Visit (HOSPITAL_COMMUNITY): Payer: Self-pay

## 2022-10-16 ENCOUNTER — Ambulatory Visit: Payer: 59 | Attending: Cardiovascular Disease | Admitting: Cardiovascular Disease

## 2022-10-16 VITALS — BP 116/82 | HR 85 | Ht 67.0 in | Wt 215.6 lb

## 2022-10-16 DIAGNOSIS — E782 Mixed hyperlipidemia: Secondary | ICD-10-CM

## 2022-10-16 DIAGNOSIS — I10 Essential (primary) hypertension: Secondary | ICD-10-CM | POA: Diagnosis not present

## 2022-10-16 NOTE — Patient Instructions (Signed)
Medication Instructions:  Your physician recommends that you continue on your current medications as directed. Please refer to the Current Medication list given to you today.  *If you need a refill on your cardiac medications before your next appointment, please call your pharmacy*   Lab Work: NONE If you have labs (blood work) drawn today and your tests are completely normal, you will receive your results only by: MyChart Message (if you have MyChart) OR A paper copy in the mail If you have any lab test that is abnormal or we need to change your treatment, we will call you to review the results.   Testing/Procedures: NONE   Follow-Up: At [facility], you and your health needs are our priority.  As part of our continuing mission to provide you with exceptional heart care, we have created designated Provider Care Teams.  These Care Teams include your primary Cardiologist (physician) and Advanced Practice Providers (APPs -  Physician Assistants and Nurse Practitioners) who all work together to provide you with the care you need, when you need it.  We recommend signing up for the patient portal called "MyChart".  Sign up information is provided on this After Visit Summary.  MyChart is used to connect with patients for Virtual Visits (Telemedicine).  Patients are able to view lab/test results, encounter notes, upcoming appointments, etc.  Non-urgent messages can be sent to your provider as well.   To learn more about what you can do with MyChart, go to [email or web address].    Your next appointment:   1 year(s)  Provider:   Philip Nahser, MD      

## 2022-10-17 ENCOUNTER — Other Ambulatory Visit (HOSPITAL_COMMUNITY): Payer: Self-pay

## 2022-11-05 ENCOUNTER — Other Ambulatory Visit: Payer: Self-pay | Admitting: Cardiovascular Disease

## 2022-11-05 ENCOUNTER — Other Ambulatory Visit (HOSPITAL_COMMUNITY): Payer: Self-pay

## 2022-11-05 MED ORDER — AMLODIPINE BESYLATE 5 MG PO TABS
5.0000 mg | ORAL_TABLET | Freq: Every day | ORAL | 3 refills | Status: AC
  Filled 2022-11-05: qty 90, 90d supply, fill #0

## 2022-11-06 ENCOUNTER — Other Ambulatory Visit (HOSPITAL_COMMUNITY): Payer: Self-pay

## 2022-11-12 ENCOUNTER — Other Ambulatory Visit (HOSPITAL_COMMUNITY): Payer: Self-pay

## 2022-11-12 MED ORDER — OXYCODONE HCL 10 MG PO TABS
10.0000 mg | ORAL_TABLET | Freq: Four times a day (QID) | ORAL | 0 refills | Status: AC | PRN
  Filled 2022-11-12 – 2022-11-18 (×2): qty 105, 27d supply, fill #0

## 2022-11-18 ENCOUNTER — Other Ambulatory Visit (HOSPITAL_COMMUNITY): Payer: Self-pay

## 2022-12-17 ENCOUNTER — Other Ambulatory Visit (HOSPITAL_COMMUNITY): Payer: Self-pay

## 2022-12-17 MED ORDER — ATORVASTATIN CALCIUM 10 MG PO TABS
10.0000 mg | ORAL_TABLET | Freq: Every day | ORAL | 5 refills | Status: AC
  Filled 2022-12-17: qty 30, 30d supply, fill #0

## 2022-12-17 MED ORDER — OXYCODONE HCL 10 MG PO TABS
10.0000 mg | ORAL_TABLET | Freq: Four times a day (QID) | ORAL | 0 refills | Status: AC | PRN
  Filled 2022-12-17: qty 105, 27d supply, fill #0

## 2022-12-30 ENCOUNTER — Other Ambulatory Visit (HOSPITAL_COMMUNITY): Payer: Self-pay

## 2023-01-14 ENCOUNTER — Other Ambulatory Visit (HOSPITAL_COMMUNITY): Payer: Self-pay

## 2023-01-15 ENCOUNTER — Other Ambulatory Visit (HOSPITAL_COMMUNITY): Payer: Self-pay

## 2023-01-17 ENCOUNTER — Other Ambulatory Visit (HOSPITAL_COMMUNITY): Payer: Self-pay

## 2023-01-17 MED ORDER — OXYCODONE HCL 10 MG PO TABS
10.0000 mg | ORAL_TABLET | Freq: Three times a day (TID) | ORAL | 0 refills | Status: AC | PRN
  Filled 2023-01-17: qty 105, 35d supply, fill #0

## 2023-02-10 DIAGNOSIS — I10 Essential (primary) hypertension: Secondary | ICD-10-CM | POA: Diagnosis not present

## 2023-02-10 DIAGNOSIS — E876 Hypokalemia: Secondary | ICD-10-CM | POA: Diagnosis not present

## 2023-02-10 DIAGNOSIS — Z833 Family history of diabetes mellitus: Secondary | ICD-10-CM | POA: Diagnosis not present

## 2023-02-10 DIAGNOSIS — G4700 Insomnia, unspecified: Secondary | ICD-10-CM | POA: Diagnosis not present

## 2023-02-10 DIAGNOSIS — M1990 Unspecified osteoarthritis, unspecified site: Secondary | ICD-10-CM | POA: Diagnosis not present

## 2023-02-11 ENCOUNTER — Other Ambulatory Visit (HOSPITAL_COMMUNITY): Payer: Self-pay

## 2023-02-11 MED ORDER — OXYCODONE HCL 10 MG PO TABS
10.0000 mg | ORAL_TABLET | Freq: Three times a day (TID) | ORAL | 0 refills | Status: AC | PRN
  Filled 2023-02-18 – 2023-02-19 (×2): qty 105, 35d supply, fill #0

## 2023-02-18 ENCOUNTER — Other Ambulatory Visit (HOSPITAL_COMMUNITY): Payer: Self-pay

## 2023-02-19 ENCOUNTER — Other Ambulatory Visit (HOSPITAL_COMMUNITY): Payer: Self-pay

## 2023-03-25 ENCOUNTER — Other Ambulatory Visit (HOSPITAL_COMMUNITY): Payer: Self-pay

## 2023-03-25 MED ORDER — OXYCODONE HCL 10 MG PO TABS
10.0000 mg | ORAL_TABLET | Freq: Three times a day (TID) | ORAL | 0 refills | Status: AC | PRN
  Filled 2023-03-25: qty 105, 35d supply, fill #0

## 2023-03-27 ENCOUNTER — Other Ambulatory Visit (HOSPITAL_COMMUNITY): Payer: Self-pay

## 2023-03-27 MED ORDER — ATORVASTATIN CALCIUM 20 MG PO TABS
20.0000 mg | ORAL_TABLET | Freq: Every day | ORAL | 5 refills | Status: AC
  Filled 2023-03-27: qty 30, 30d supply, fill #0

## 2023-03-27 MED ORDER — AMLODIPINE BESYLATE 10 MG PO TABS
10.0000 mg | ORAL_TABLET | Freq: Every day | ORAL | 5 refills | Status: DC
  Filled 2023-03-27: qty 30, 30d supply, fill #0

## 2023-04-08 ENCOUNTER — Other Ambulatory Visit (HOSPITAL_COMMUNITY): Payer: Self-pay

## 2023-04-22 ENCOUNTER — Other Ambulatory Visit (HOSPITAL_COMMUNITY): Payer: Self-pay

## 2023-04-22 MED ORDER — OXYCODONE HCL 10 MG PO TABS
10.0000 mg | ORAL_TABLET | Freq: Three times a day (TID) | ORAL | 0 refills | Status: DC | PRN
  Filled 2023-04-22: qty 105, 30d supply, fill #0

## 2023-05-09 DIAGNOSIS — D72829 Elevated white blood cell count, unspecified: Secondary | ICD-10-CM | POA: Diagnosis not present

## 2023-05-20 ENCOUNTER — Other Ambulatory Visit (HOSPITAL_COMMUNITY): Payer: Self-pay

## 2023-05-20 MED ORDER — OXYCODONE HCL 10 MG PO TABS
10.0000 mg | ORAL_TABLET | Freq: Three times a day (TID) | ORAL | 0 refills | Status: DC | PRN
  Filled 2023-05-20: qty 105, 30d supply, fill #0

## 2023-06-17 ENCOUNTER — Other Ambulatory Visit (HOSPITAL_COMMUNITY): Payer: Self-pay

## 2023-06-17 MED ORDER — OXYCODONE HCL 10 MG PO TABS
10.0000 mg | ORAL_TABLET | Freq: Four times a day (QID) | ORAL | 0 refills | Status: DC | PRN
  Filled 2023-06-17: qty 105, 27d supply, fill #0

## 2023-06-17 MED ORDER — OXYCODONE HCL 10 MG PO TABS
10.0000 mg | ORAL_TABLET | Freq: Three times a day (TID) | ORAL | 0 refills | Status: DC | PRN
  Filled 2023-06-17: qty 105, 35d supply, fill #0

## 2023-06-17 MED ORDER — NALOXONE HCL 4 MG/0.1ML NA LIQD
1.0000 | Freq: Once | NASAL | 0 refills | Status: AC | PRN
  Filled 2023-06-17: qty 2, 30d supply, fill #0

## 2023-07-22 ENCOUNTER — Other Ambulatory Visit (HOSPITAL_COMMUNITY): Payer: Self-pay

## 2023-07-22 MED ORDER — ATORVASTATIN CALCIUM 20 MG PO TABS
20.0000 mg | ORAL_TABLET | Freq: Every day | ORAL | 5 refills | Status: AC
  Filled 2023-07-22: qty 30, 30d supply, fill #0
  Filled 2023-12-16: qty 30, 30d supply, fill #1

## 2023-07-22 MED ORDER — OXYCODONE HCL 10 MG PO TABS
10.0000 mg | ORAL_TABLET | Freq: Four times a day (QID) | ORAL | 0 refills | Status: DC | PRN
  Filled 2023-07-22: qty 105, 30d supply, fill #0

## 2023-08-19 ENCOUNTER — Other Ambulatory Visit (HOSPITAL_COMMUNITY): Payer: Self-pay

## 2023-08-19 MED ORDER — AMLODIPINE BESYLATE 10 MG PO TABS
10.0000 mg | ORAL_TABLET | Freq: Every day | ORAL | 5 refills | Status: DC
  Filled 2023-08-19: qty 30, 30d supply, fill #0

## 2023-08-19 MED ORDER — OXYCODONE HCL 10 MG PO TABS
10.0000 mg | ORAL_TABLET | Freq: Four times a day (QID) | ORAL | 0 refills | Status: DC | PRN
  Filled 2023-08-19: qty 105, 30d supply, fill #0

## 2023-09-16 ENCOUNTER — Other Ambulatory Visit (HOSPITAL_COMMUNITY): Payer: Self-pay

## 2023-09-16 MED ORDER — OXYCODONE HCL 10 MG PO TABS
10.0000 mg | ORAL_TABLET | Freq: Four times a day (QID) | ORAL | 0 refills | Status: DC | PRN
  Filled 2023-09-16: qty 105, 30d supply, fill #0

## 2023-10-15 ENCOUNTER — Other Ambulatory Visit: Payer: Self-pay

## 2023-10-15 ENCOUNTER — Other Ambulatory Visit (HOSPITAL_COMMUNITY): Payer: Self-pay

## 2023-10-15 MED ORDER — OXYCODONE HCL 10 MG PO TABS
10.0000 mg | ORAL_TABLET | Freq: Four times a day (QID) | ORAL | 0 refills | Status: DC | PRN
  Filled 2023-10-15: qty 105, 30d supply, fill #0

## 2023-11-11 ENCOUNTER — Other Ambulatory Visit (HOSPITAL_COMMUNITY): Payer: Self-pay

## 2023-11-11 MED ORDER — OXYCODONE HCL 10 MG PO TABS
10.0000 mg | ORAL_TABLET | Freq: Four times a day (QID) | ORAL | 0 refills | Status: DC | PRN
  Filled 2023-11-14: qty 105, 30d supply, fill #0
  Filled ????-??-??: fill #0

## 2023-11-14 ENCOUNTER — Other Ambulatory Visit (HOSPITAL_COMMUNITY): Payer: Self-pay

## 2023-12-16 ENCOUNTER — Other Ambulatory Visit (HOSPITAL_COMMUNITY): Payer: Self-pay

## 2023-12-16 MED ORDER — OXYCODONE HCL 10 MG PO TABS
10.0000 mg | ORAL_TABLET | Freq: Four times a day (QID) | ORAL | 0 refills | Status: DC | PRN
  Filled 2023-12-16: qty 105, 30d supply, fill #0

## 2024-02-25 ENCOUNTER — Other Ambulatory Visit (HOSPITAL_COMMUNITY): Payer: Self-pay

## 2024-02-25 MED ORDER — OXYCODONE HCL 10 MG PO TABS
10.0000 mg | ORAL_TABLET | Freq: Four times a day (QID) | ORAL | 0 refills | Status: DC | PRN
  Filled 2024-02-25: qty 105, 30d supply, fill #0

## 2024-02-25 MED ORDER — VALSARTAN 160 MG PO TABS
160.0000 mg | ORAL_TABLET | Freq: Every day | ORAL | 5 refills | Status: AC
  Filled 2024-02-25: qty 30, 30d supply, fill #0
  Filled 2024-03-24: qty 30, 30d supply, fill #1

## 2024-03-15 ENCOUNTER — Other Ambulatory Visit: Payer: Self-pay

## 2024-03-24 ENCOUNTER — Other Ambulatory Visit (HOSPITAL_COMMUNITY): Payer: Self-pay

## 2024-03-24 MED ORDER — OXYCODONE HCL 10 MG PO TABS
10.0000 mg | ORAL_TABLET | Freq: Four times a day (QID) | ORAL | 0 refills | Status: AC | PRN
  Filled 2024-03-24: qty 105, 30d supply, fill #0

## 2024-03-24 MED ORDER — ATORVASTATIN CALCIUM 20 MG PO TABS
20.0000 mg | ORAL_TABLET | Freq: Every day | ORAL | 5 refills | Status: AC
  Filled 2024-03-24: qty 30, 30d supply, fill #0

## 2024-04-05 ENCOUNTER — Other Ambulatory Visit (HOSPITAL_COMMUNITY): Payer: Self-pay

## 2024-04-22 DIAGNOSIS — H5712 Ocular pain, left eye: Secondary | ICD-10-CM | POA: Diagnosis not present

## 2024-04-22 DIAGNOSIS — T1502XA Foreign body in cornea, left eye, initial encounter: Secondary | ICD-10-CM | POA: Diagnosis not present
